# Patient Record
Sex: Female | Born: 1975 | Race: Black or African American | Hispanic: No | Marital: Single | Smoking: Never smoker
Health system: Southern US, Community
[De-identification: ages and names within clinical notes are randomized; demographics above are authoritative.]

## PROBLEM LIST (undated history)

## (undated) DIAGNOSIS — C541 Malignant neoplasm of endometrium: Secondary | ICD-10-CM

## (undated) DIAGNOSIS — D649 Anemia, unspecified: Secondary | ICD-10-CM

## (undated) DIAGNOSIS — N920 Excessive and frequent menstruation with regular cycle: Secondary | ICD-10-CM

## (undated) HISTORY — DX: Malignant neoplasm of endometrium: C54.1

## (undated) HISTORY — PX: WISDOM TOOTH EXTRACTION: SHX21

## (undated) HISTORY — PX: DILATION AND CURETTAGE OF UTERUS: SHX78

## (undated) HISTORY — DX: Anemia, unspecified: D64.9

## (undated) HISTORY — DX: Excessive and frequent menstruation with regular cycle: N92.0

---

## 1999-05-02 ENCOUNTER — Ambulatory Visit (HOSPITAL_BASED_OUTPATIENT_CLINIC_OR_DEPARTMENT_OTHER): Admission: RE | Admit: 1999-05-02 | Discharge: 1999-05-02 | Payer: Self-pay | Admitting: *Deleted

## 2010-01-21 ENCOUNTER — Inpatient Hospital Stay (HOSPITAL_COMMUNITY): Admission: AD | Admit: 2010-01-21 | Discharge: 2010-01-21 | Payer: Self-pay | Admitting: Obstetrics & Gynecology

## 2010-02-03 ENCOUNTER — Emergency Department (HOSPITAL_COMMUNITY): Admission: EM | Admit: 2010-02-03 | Discharge: 2010-02-03 | Payer: Self-pay | Admitting: Emergency Medicine

## 2010-02-17 ENCOUNTER — Emergency Department (HOSPITAL_COMMUNITY): Admission: EM | Admit: 2010-02-17 | Discharge: 2010-02-17 | Payer: Self-pay | Admitting: Emergency Medicine

## 2010-02-17 ENCOUNTER — Ambulatory Visit: Payer: Self-pay | Admitting: Obstetrics & Gynecology

## 2010-02-18 ENCOUNTER — Encounter: Payer: Self-pay | Admitting: Obstetrics & Gynecology

## 2010-02-18 ENCOUNTER — Ambulatory Visit: Payer: Self-pay | Admitting: Vascular Surgery

## 2010-03-10 ENCOUNTER — Ambulatory Visit: Payer: Self-pay | Admitting: Obstetrics & Gynecology

## 2010-03-10 LAB — CONVERTED CEMR LAB: Pap Smear: NEGATIVE

## 2010-03-22 ENCOUNTER — Inpatient Hospital Stay (HOSPITAL_COMMUNITY): Admission: AD | Admit: 2010-03-22 | Discharge: 2010-03-22 | Payer: Self-pay | Admitting: Obstetrics & Gynecology

## 2010-03-23 ENCOUNTER — Inpatient Hospital Stay (HOSPITAL_COMMUNITY): Admission: EM | Admit: 2010-03-23 | Discharge: 2010-03-25 | Payer: Self-pay | Admitting: Emergency Medicine

## 2010-03-23 ENCOUNTER — Encounter: Payer: Self-pay | Admitting: Obstetrics & Gynecology

## 2010-03-23 ENCOUNTER — Ambulatory Visit: Payer: Self-pay | Admitting: Vascular Surgery

## 2010-03-23 DIAGNOSIS — I82409 Acute embolism and thrombosis of unspecified deep veins of unspecified lower extremity: Secondary | ICD-10-CM | POA: Insufficient documentation

## 2010-03-23 LAB — CONVERTED CEMR LAB
Albumin: 3 g/dL
BUN: 11 mg/dL
Calcium: 9 mg/dL
Chloride: 107 meq/L
Glucose, Bld: 152 mg/dL
Potassium: 3.9 meq/L
Sodium: 138 meq/L
Total Protein: 7.1 g/dL

## 2010-03-25 ENCOUNTER — Encounter (INDEPENDENT_AMBULATORY_CARE_PROVIDER_SITE_OTHER): Payer: Self-pay | Admitting: Nurse Practitioner

## 2010-03-28 ENCOUNTER — Encounter (INDEPENDENT_AMBULATORY_CARE_PROVIDER_SITE_OTHER): Payer: Self-pay | Admitting: Nurse Practitioner

## 2010-03-28 ENCOUNTER — Ambulatory Visit: Payer: Self-pay | Admitting: Internal Medicine

## 2010-03-31 ENCOUNTER — Ambulatory Visit: Payer: Self-pay | Admitting: Nurse Practitioner

## 2010-04-01 ENCOUNTER — Ambulatory Visit: Payer: Self-pay | Admitting: Obstetrics & Gynecology

## 2010-04-04 ENCOUNTER — Ambulatory Visit: Payer: Self-pay | Admitting: Nurse Practitioner

## 2010-04-05 LAB — CONVERTED CEMR LAB
HCT: 31.9 %
MCHC: 33 g/dL
MCV: 74.7 fL
Platelets: 430 10*3/uL
RBC: 4.27 M/uL
WBC: 9.5 10*3/uL

## 2010-04-06 ENCOUNTER — Encounter (INDEPENDENT_AMBULATORY_CARE_PROVIDER_SITE_OTHER): Payer: Self-pay | Admitting: Nurse Practitioner

## 2010-04-07 ENCOUNTER — Ambulatory Visit: Payer: Self-pay | Admitting: Obstetrics & Gynecology

## 2010-04-07 ENCOUNTER — Ambulatory Visit (HOSPITAL_COMMUNITY): Admission: RE | Admit: 2010-04-07 | Discharge: 2010-04-07 | Payer: Self-pay | Admitting: Obstetrics & Gynecology

## 2010-04-07 DIAGNOSIS — C541 Malignant neoplasm of endometrium: Secondary | ICD-10-CM | POA: Insufficient documentation

## 2010-04-08 ENCOUNTER — Ambulatory Visit: Payer: Self-pay | Admitting: Internal Medicine

## 2010-04-08 DIAGNOSIS — N920 Excessive and frequent menstruation with regular cycle: Secondary | ICD-10-CM

## 2010-04-08 DIAGNOSIS — D508 Other iron deficiency anemias: Secondary | ICD-10-CM | POA: Insufficient documentation

## 2010-04-08 DIAGNOSIS — E669 Obesity, unspecified: Secondary | ICD-10-CM

## 2010-04-08 DIAGNOSIS — R030 Elevated blood-pressure reading, without diagnosis of hypertension: Secondary | ICD-10-CM | POA: Insufficient documentation

## 2010-04-11 ENCOUNTER — Ambulatory Visit: Payer: Self-pay | Admitting: Nurse Practitioner

## 2010-04-11 ENCOUNTER — Encounter (INDEPENDENT_AMBULATORY_CARE_PROVIDER_SITE_OTHER): Payer: Self-pay | Admitting: Nurse Practitioner

## 2010-04-12 ENCOUNTER — Encounter: Payer: Self-pay | Admitting: Nurse Practitioner

## 2010-04-12 LAB — CONVERTED CEMR LAB: Prothrombin Time: 19.6 s

## 2010-04-14 ENCOUNTER — Ambulatory Visit: Payer: Self-pay | Admitting: Nurse Practitioner

## 2010-04-19 ENCOUNTER — Encounter: Payer: Self-pay | Admitting: Nurse Practitioner

## 2010-04-19 LAB — CONVERTED CEMR LAB: INR: 2.1

## 2010-04-20 ENCOUNTER — Ambulatory Visit: Payer: Self-pay | Admitting: Obstetrics & Gynecology

## 2010-04-20 ENCOUNTER — Ambulatory Visit: Payer: Self-pay | Admitting: Nurse Practitioner

## 2010-04-21 ENCOUNTER — Ambulatory Visit: Admission: RE | Admit: 2010-04-21 | Discharge: 2010-04-21 | Payer: Self-pay | Admitting: Gynecologic Oncology

## 2010-04-22 ENCOUNTER — Encounter: Payer: Self-pay | Admitting: Nurse Practitioner

## 2010-05-02 ENCOUNTER — Encounter (INDEPENDENT_AMBULATORY_CARE_PROVIDER_SITE_OTHER): Payer: Self-pay | Admitting: Nurse Practitioner

## 2010-05-02 ENCOUNTER — Ambulatory Visit: Payer: Self-pay | Admitting: Nurse Practitioner

## 2010-05-03 ENCOUNTER — Encounter: Payer: Self-pay | Admitting: Nurse Practitioner

## 2010-05-27 ENCOUNTER — Ambulatory Visit: Payer: Self-pay | Admitting: Nurse Practitioner

## 2010-05-30 ENCOUNTER — Ambulatory Visit: Payer: Self-pay | Admitting: Obstetrics & Gynecology

## 2010-06-02 ENCOUNTER — Encounter: Payer: Self-pay | Admitting: Nurse Practitioner

## 2010-06-14 ENCOUNTER — Encounter (INDEPENDENT_AMBULATORY_CARE_PROVIDER_SITE_OTHER): Payer: Self-pay | Admitting: Nurse Practitioner

## 2010-06-14 ENCOUNTER — Ambulatory Visit: Payer: Self-pay | Admitting: Internal Medicine

## 2010-06-15 ENCOUNTER — Encounter: Payer: Self-pay | Admitting: Nurse Practitioner

## 2010-06-15 LAB — CONVERTED CEMR LAB: INR: 2.2

## 2010-07-05 ENCOUNTER — Encounter (INDEPENDENT_AMBULATORY_CARE_PROVIDER_SITE_OTHER): Payer: Self-pay | Admitting: Nurse Practitioner

## 2010-07-05 ENCOUNTER — Encounter (INDEPENDENT_AMBULATORY_CARE_PROVIDER_SITE_OTHER): Payer: Self-pay | Admitting: Internal Medicine

## 2010-07-05 ENCOUNTER — Ambulatory Visit: Payer: Self-pay | Admitting: Nurse Practitioner

## 2010-07-05 LAB — CONVERTED CEMR LAB
INR: 2.17 — ABNORMAL HIGH (ref <1.50)
Prothrombin Time: 24.3 s — ABNORMAL HIGH (ref 11.6–15.2)

## 2010-08-02 ENCOUNTER — Encounter (INDEPENDENT_AMBULATORY_CARE_PROVIDER_SITE_OTHER): Payer: Self-pay | Admitting: Nurse Practitioner

## 2010-08-02 ENCOUNTER — Encounter (INDEPENDENT_AMBULATORY_CARE_PROVIDER_SITE_OTHER): Payer: Self-pay | Admitting: Internal Medicine

## 2010-08-09 ENCOUNTER — Encounter (INDEPENDENT_AMBULATORY_CARE_PROVIDER_SITE_OTHER): Payer: Self-pay | Admitting: Internal Medicine

## 2010-08-09 ENCOUNTER — Encounter (INDEPENDENT_AMBULATORY_CARE_PROVIDER_SITE_OTHER): Payer: Self-pay | Admitting: Nurse Practitioner

## 2010-08-09 LAB — CONVERTED CEMR LAB
INR: 1.76 — ABNORMAL HIGH (ref <1.50)
Prothrombin Time: 20.7 s — ABNORMAL HIGH (ref 11.6–15.2)

## 2010-08-10 ENCOUNTER — Encounter: Payer: Self-pay | Admitting: Nurse Practitioner

## 2010-08-10 LAB — CONVERTED CEMR LAB: Prothrombin Time: 17.7 s — ABNORMAL HIGH (ref 11.6–15.2)

## 2010-08-12 ENCOUNTER — Ambulatory Visit: Payer: Self-pay | Admitting: Obstetrics & Gynecology

## 2010-08-18 ENCOUNTER — Encounter (INDEPENDENT_AMBULATORY_CARE_PROVIDER_SITE_OTHER): Payer: Self-pay | Admitting: Internal Medicine

## 2010-08-18 ENCOUNTER — Encounter (INDEPENDENT_AMBULATORY_CARE_PROVIDER_SITE_OTHER): Payer: Self-pay | Admitting: Nurse Practitioner

## 2010-08-31 ENCOUNTER — Ambulatory Visit: Payer: Self-pay | Admitting: Obstetrics and Gynecology

## 2010-09-05 ENCOUNTER — Encounter (INDEPENDENT_AMBULATORY_CARE_PROVIDER_SITE_OTHER): Payer: Self-pay | Admitting: Nurse Practitioner

## 2010-09-12 ENCOUNTER — Encounter: Payer: Self-pay | Admitting: Nurse Practitioner

## 2010-09-29 ENCOUNTER — Encounter (INDEPENDENT_AMBULATORY_CARE_PROVIDER_SITE_OTHER): Payer: Self-pay | Admitting: Nurse Practitioner

## 2010-09-29 ENCOUNTER — Encounter (INDEPENDENT_AMBULATORY_CARE_PROVIDER_SITE_OTHER): Payer: Self-pay | Admitting: Internal Medicine

## 2010-09-29 LAB — CONVERTED CEMR LAB: Prothrombin Time: 32 s — ABNORMAL HIGH (ref 11.6–15.2)

## 2010-09-30 ENCOUNTER — Ambulatory Visit: Payer: Self-pay | Admitting: Obstetrics & Gynecology

## 2010-10-12 ENCOUNTER — Ambulatory Visit: Payer: Self-pay | Admitting: Obstetrics and Gynecology

## 2010-10-12 ENCOUNTER — Encounter (INDEPENDENT_AMBULATORY_CARE_PROVIDER_SITE_OTHER): Payer: Self-pay | Admitting: Nurse Practitioner

## 2010-10-21 ENCOUNTER — Encounter: Payer: Self-pay | Admitting: Nurse Practitioner

## 2010-11-01 ENCOUNTER — Encounter (INDEPENDENT_AMBULATORY_CARE_PROVIDER_SITE_OTHER): Payer: Self-pay | Admitting: Nurse Practitioner

## 2010-11-07 ENCOUNTER — Encounter: Payer: Self-pay | Admitting: Nurse Practitioner

## 2010-11-17 ENCOUNTER — Ambulatory Visit: Admit: 2010-11-17 | Payer: Self-pay | Admitting: Obstetrics and Gynecology

## 2010-11-22 ENCOUNTER — Encounter (INDEPENDENT_AMBULATORY_CARE_PROVIDER_SITE_OTHER): Payer: Self-pay | Admitting: Nurse Practitioner

## 2010-11-22 NOTE — Letter (Signed)
Summary: ANTICOAGULATION DOSING  ANTICOAGULATION DOSING   Imported By: Arta Bruce 05/04/2010 10:26:44  _____________________________________________________________________  External Attachment:    Type:   Image     Comment:   External Document

## 2010-11-22 NOTE — Miscellaneous (Signed)
Summary: PT/INR  Clinical Lists Changes  Anticoagulation Management History:      The patient is on warfarin for the first episode of deep venous thrombosis and/or pulmonary embolism.  Negative risk factors for bleeding include an age less than 34 years old, no history of CVA/TIA, no history of GI bleeding, and absence of serious comorbidities.  The bleeding index is 'low risk'.  Negative CHADS2 values include History of CHF, History of HTN, Age > 2 years old, History of Diabetes, and Prior Stroke/CVA/TIA.  The CHADS2 risk level is 'Low (CHADS2<2)-Bridging Rx Optional'.  The start date was 04/08/2010.  Anticipated length of treatment is 3-6 months.  Today's INR is 1.5.  Prothrombin time is 19.6.    Anticoagulation Management Assessment/Plan:      The patient's current anticoagulation dose is Coumadin 10 mg tabs: One tablet by mouth daily.  The target INR is 2.0-3.0.         Prior Anticoagulation Instructions: Coumadin 10 mg tabs: One tablet by mouth daily.    Current Anticoagulation Instructions: Per coumdin clinic:15mg  x 2 days then 10mg  by mouth daily recheck in 4 days  Observations: Added new observation of PT PATIENT: 19.6 s (04/12/2010 13:00) Added new observation of INR: 1.5  (04/12/2010 13:00) Added new observation of INSTRUCT C: Per coumdin clinic:15mg  x 2 days then 10mg  by mouth daily recheck in 4 days  (04/12/2010 13:00) Added new observation of COUM DOSE: 13^08^65^78^46^96^29  (04/12/2010 13:00) Added new observation of COUMADIN CHG: 0  (04/12/2010 13:00) Added new observation of COUM TOT WK: 70 mg  (04/12/2010 13:00)

## 2010-11-22 NOTE — Miscellaneous (Signed)
Summary: Coumadin Clinic Update   Anticoagulation Management History:      The patient is taking warfarin and comes in today for a routine follow up visit.  The patient is on warfarin for the first episode of deep venous thrombosis and/or pulmonary embolism.  Negative risk factors for bleeding include an age less than 34 years old, no history of CVA/TIA, no history of GI bleeding, and absence of serious comorbidities.  The bleeding index is 'low risk'.  Negative CHADS2 values include History of CHF, History of HTN, Age > 64 years old, History of Diabetes, and Prior Stroke/CVA/TIA.  The CHADS2 risk level is 'Low (CHADS2<2)-Bridging Rx Optional'.  The start date was 04/08/2010.  Anticipated length of treatment is 3-6 months.  Her last INR was 2.1 and today's INR is 2.1.    Anticoagulation Management Assessment/Plan:      The patient's current anticoagulation dose is Coumadin 10 mg tabs: One tablet by mouth daily.  The target INR is 2.0-3.0.  The next INR is due 04/29/2010.         Current Anticoagulation Instructions: Same as Prior Instructions. Clinical Lists Changes  Observations: Added new observation of NEXT PT: 04/29/2010 (04/22/2010 9:19) Added new observation of INSTRUCT C: The patient is to continue with the same dose of coumadin.  This dosage includes:  Coumadin 10 mg tabs: One tablet by mouth daily.   (04/22/2010 9:19) Added new observation of INR: 2.1  (04/22/2010 9:19) Added new observation of COUM DOSE: 08^65^78^46^96^29^52  (04/22/2010 9:19) Added new observation of COUMADIN CHG: 0  (04/22/2010 9:19) Added new observation of COUM TOT WK: 70 mg  (04/22/2010 9:19)

## 2010-11-22 NOTE — Letter (Signed)
Summary: ANTICOAGULATION DOSING  ANTICOAGULATION DOSING   Imported By: Arta Bruce 09/06/2010 15:04:05  _____________________________________________________________________  External Attachment:    Type:   Image     Comment:   External Document

## 2010-11-22 NOTE — Letter (Signed)
Summary: ANTICOAGULATION DOSING SERVICES  ANTICOAGULATION DOSING SERVICES   Imported By: Arta Bruce 06/08/2010 12:39:42  _____________________________________________________________________  External Attachment:    Type:   Image     Comment:   External Document

## 2010-11-22 NOTE — Letter (Signed)
Summary: ANTICOAGULATION DOSING  ANTICOAGULATION DOSING   Imported By: Arta Bruce 04/22/2010 10:04:45  _____________________________________________________________________  External Attachment:    Type:   Image     Comment:   External Document

## 2010-11-22 NOTE — Letter (Signed)
Summary: ANTICOAGULATION DOSING  ANTICOAGULATION DOSING   Imported By: Arta Bruce 04/14/2010 12:57:15  _____________________________________________________________________  External Attachment:    Type:   Image     Comment:   External Document

## 2010-11-22 NOTE — Letter (Signed)
Summary: ANTICOAGULATION DOSING  ANTICOAGULATION DOSING   Imported By: Arta Bruce 07/12/2010 14:19:14  _____________________________________________________________________  External Attachment:    Type:   Image     Comment:   External Document

## 2010-11-22 NOTE — Miscellaneous (Signed)
Summary: Coumadin clinic update  Clinical Lists Changes  Observations: Added new observation of NEXT PT: 06/10/2010 (06/02/2010 9:11) Added new observation of INSTRUCT C: coumadin 15mg  x 1 then resume Coumadin 10 mg tabs: One tablet by mouth daily.   (06/02/2010 9:11) Added new observation of INR: 1.8  (06/02/2010 9:11) Added new observation of COUM DOSE: 40^34^74^25^95^63^87  (06/02/2010 9:11) Added new observation of COUMADIN CHG: 0  (06/02/2010 9:11) Added new observation of COUM TOT WK: 70 mg  (06/02/2010 9:11)     Anticoagulation Management History:      The patient is taking warfarin and comes in today for a routine follow up visit.  She is being anticoagulated due to the first episode of deep venous thrombosis and/or pulmonary embolism.  Negative risk factors for bleeding include an age less than 1 years old, no history of CVA/TIA, no history of GI bleeding, and absence of serious comorbidities.  The bleeding index is 'low risk'.  Negative CHADS2 values include History of CHF, History of HTN, Age > 78 years old, History of Diabetes, and Prior Stroke/CVA/TIA.  The CHADS2 risk level is 'Low (CHADS2<2)-Bridging Rx Optional'.  The start date was 04/08/2010.  Anticipated length of treatment is 3-6 months.  Her last INR was 2.0 and today's INR is 1.8.     Anticoagulation Management Assessment/Plan:      The patient's current anticoagulation dose is Coumadin 10 mg tabs: One tablet by mouth daily.  The target INR is 2.0-3.0.  The next INR is due 06/10/2010.         Prior Anticoagulation Instructions: The patient is to continue with the same dose of coumadin.  This dosage includes:  Coumadin 10 mg tabs: One tablet by mouth daily.    Current Anticoagulation Instructions: coumadin 15mg  x 1 then resume Coumadin 10 mg tabs: One tablet by mouth daily.

## 2010-11-22 NOTE — Assessment & Plan Note (Signed)
Summary: NEW - Establish    Vital Signs:  Patient profile:   35 year old female LMP:     04/04/2010 Height:      63.50 inches Weight:      418.7 pounds BMI:     73.27 BSA:     2.66 Temp:     97.9 degrees F oral Pulse rate:   97 / minute Pulse rhythm:   regular Resp:     20 per minute BP sitting:   167 / 72  (left arm) Cuff size:   regular  Vitals Entered By: Levon Hedger (April 08, 2010 2:30 PM) CC: follow-up visit [place] .Marland Kitchen..pt is having a problem with her periods Is Patient Diabetic? No Pain Assessment Patient in pain? no       Does patient need assistance? Functional Status Self care Ambulation Normal LMP (date): 04/04/2010     Enter LMP: 04/04/2010 Last PAP Result NEGATIVE FOR INTRAEPITHELIAL LESIONS OR MALIGNANCY.   CC:  follow-up visit [place] .Marland Kitchen..pt is having a problem with her periods.  History of Present Illness:  Pt into the office to establish care. No previous PCP  GYN - pt had last visit on yesterday to GYN. Started in April with menorrhagia. She was started on oral contraceptives after a few weeks of being on the med she went to the ER and was dx with anemia secondary to menorrhagia.  C/o dizziness and SOB. She was transfused 5 units PRBC's.   A few weeks later she started with pain and swelling in her right lower extremitiy. Pt then went to the ER and she was admitted with Right DVT -Tobacco +oral contraceptives at time of DVT Dx but have since been discontinued -family history of blood clots PT/INR has been checked at the coumadin clnic at [facility] earlier this week.  She stopped lovenox for 1 day in preparation for outpt GYN procedure (D&C) and restarted today.  She has 3 doses left. 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  Negative risk factors for bleeding include an age less than 77 years old, no history of CVA/TIA, no history of GI bleeding, and absence of serious comorbidities.  The bleeding index is 'low risk'.  Negative CHADS2 values include History of CHF, History of HTN, Age > 96 years old, History of Diabetes, and Prior Stroke/CVA/TIA.  The CHADS2 risk level is 'Low (CHADS2<2)-Bridging Rx Optional'.  The start date is 04/08/2010.  Anticipated length of treatment is 3-6 months.     Habits & Providers  Alcohol-Tobacco-Diet     Alcohol drinks/day: 0     Tobacco Status: never  Exercise-Depression-Behavior     Does Patient Exercise: no     Drug Use: never  Allergies (verified): 1)  ! Vicodin 2)  ! Morphine  Family History: Said to be positive for htn and diabetes  Social History: No children No ETOH NO tobacco  No drugsSmoking Status:  never Drug Use:  never Does Patient Exercise:  no  Review of Systems General:  Denies fever. CV:  Denies chest pain or discomfort. Resp:  Denies cough. GI:  Denies abdominal pain, nausea, and vomiting. GU:  Denies abnormal vaginal bleeding; s/p GYN procedure on yesterday.  Physical Exam  General:  alert.  morbid obesity Head:  normocephalic.   Lungs:  normal breath sounds.   Heart:  normal rate and regular rhythm.   Msk:  negative homan's Pulses:  DP +2 Extremities:  no edema Neurologic:  alert & oriented X3.   Psych:  Oriented X3.     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 One tablet by mouth daily  Complete Medication List: 1)  Ferrous Sulfate 325 (65 Fe) Mg Tabs (Ferrous sulfate) .... One tablet by mouth daily 2)  Multivitamins Tabs (Multiple vitamin) .... One tablet by mouth daily 3)  Coumadin 10 Mg Tabs (Warfarin sodium) .... One tablet by mouth daily  Anticoagulation Management Assessment/Plan:      The patient's current anticoagulation dose is Coumadin 10 mg tabs: One tablet by mouth daily.  The target INR is 2.0-3.0.         Current Anticoagulation Instructions: Coumadin 10 mg tabs: One tablet by mouth daily.    Patient Instructions: 1)  Keep your appointment at the coumadin clinic on Monday at 2:10PM 2)  Finish the lovenox as ordered

## 2010-11-22 NOTE — Letter (Signed)
Summary: ANTICOAGULATION DOSING  ANTICOAGULATION DOSING   Imported By: Arta Bruce 04/20/2010 12:57:14  _____________________________________________________________________  External Attachment:    Type:   Image     Comment:   External Document

## 2010-11-22 NOTE — Miscellaneous (Signed)
Summary: Coumadin clinic update   Anticoagulation Management History:      The patient is taking warfarin and comes in today for a routine follow up visit.  She is being anticoagulated due to the first episode of deep venous thrombosis and/or pulmonary embolism.  Negative risk factors for bleeding include an age less than 34 years old, no history of CVA/TIA, no history of GI bleeding, and absence of serious comorbidities.  The bleeding index is 'low risk'.  Negative CHADS2 values include History of CHF, History of HTN, Age > 5 years old, History of Diabetes, and Prior Stroke/CVA/TIA.  The CHADS2 risk level is 'Low (CHADS2<2)-Bridging Rx Optional'.  The start date was 04/08/2010.  Anticipated length of treatment is 3-6 months.  Her last INR was 2.1 and today's INR is 2.0.    Anticoagulation Management Assessment/Plan:      The patient's current anticoagulation dose is Coumadin 10 mg tabs: One tablet by mouth daily.  The target INR is 2.0-3.0.  The next INR is due 05/23/2010.         Current Anticoagulation Instructions: Same as Prior Instructions. Clinical Lists Changes  Observations: Added new observation of NEXT PT: 05/23/2010 (05/03/2010 15:16) Added new observation of INSTRUCT C: The patient is to continue with the same dose of coumadin.  This dosage includes:  Coumadin 10 mg tabs: One tablet by mouth daily.   (05/03/2010 15:16) Added new observation of INR: 2.0  (05/03/2010 15:16) Added new observation of COUM DOSE: 42^70^62^37^62^83^15  (05/03/2010 15:16) Added new observation of COUMADIN CHG: 0  (05/03/2010 15:16) Added new observation of COUM TOT WK: 70 mg  (05/03/2010 15:16)

## 2010-11-22 NOTE — Letter (Signed)
Summary: ANYICOAGULATION DOSING  ANYICOAGULATION DOSING   Imported By: Arta Bruce 08/25/2010 15:09:51  _____________________________________________________________________  External Attachment:    Type:   Image     Comment:   External Document

## 2010-11-22 NOTE — Letter (Signed)
Summary: ANTICOAGULATION DOSING  ANTICOAGULATION DOSING   Imported By: Arta Bruce 06/16/2010 12:44:33  _____________________________________________________________________  External Attachment:    Type:   Image     Comment:   External Document

## 2010-11-22 NOTE — Miscellaneous (Signed)
Summary: Coumadin Clinic update   Anticoagulation Management History:      The patient is taking warfarin and comes in today for a routine follow up visit.  The patient is on warfarin for the first episode of deep venous thrombosis and/or pulmonary embolism.  Negative risk factors for bleeding include an age less than 34 years old, no history of CVA/TIA, no history of GI bleeding, and absence of serious comorbidities.  The bleeding index is 'low risk'.  Negative CHADS2 values include History of CHF, History of HTN, Age > 80 years old, History of Diabetes, and Prior Stroke/CVA/TIA.  The CHADS2 risk level is 'Low (CHADS2<2)-Bridging Rx Optional'.  The start date was 04/08/2010.  Anticipated length of treatment is 3-6 months.  Her last INR was 1.5 and today's INR is 2.1.    Anticoagulation Management Assessment/Plan:      The patient's current anticoagulation dose is Coumadin 10 mg tabs: One tablet by mouth daily.  The target INR is 2.0-3.0.  The next INR is due 04/21/2010.         Current Anticoagulation Instructions: The patient is to continue with the same dose of coumadin.  This dosage includes:  Coumadin 10 mg tabs: One tablet by mouth daily.   Clinical Lists Changes  Observations: Added new observation of INR: 2.1  (04/19/2010 15:09) Added new observation of NEXT PT: 04/21/2010 days (04/19/2010 15:09) Added new observation of INSTRUCT C: The patient is to continue with the same dose of coumadin.  This dosage includes:  Coumadin 10 mg tabs: One tablet by mouth daily.    (04/19/2010 15:09) Added new observation of COUM DOSE: 13^08^65^78^46^96^29  (04/19/2010 15:09) Added new observation of COUMADIN CHG: 0  (04/19/2010 15:09) Added new observation of COUM TOT WK: 70 mg  (04/19/2010 15:09)

## 2010-11-22 NOTE — Letter (Signed)
Summary: Discharge Summary  Discharge Summary   Imported By: Arta Bruce 04/11/2010 15:14:06  _____________________________________________________________________  External Attachment:    Type:   Image     Comment:   External Document

## 2010-11-22 NOTE — Letter (Signed)
Summary: ANTICOAGULATION DOSING  ANTICOAGULATION DOSING   Imported By: Arta Bruce 09/13/2010 10:26:28  _____________________________________________________________________  External Attachment:    Type:   Image     Comment:   External Document

## 2010-11-22 NOTE — Letter (Signed)
Summary: ANTICOAGULATION DOSING  ANTICOAGULATION DOSING   Imported By: Arta Bruce 09/13/2010 10:15:38  _____________________________________________________________________  External Attachment:    Type:   Image     Comment:   External Document

## 2010-11-22 NOTE — Letter (Signed)
Summary: ANTICOAULATION DOSING  ANTICOAULATION DOSING   Imported By: Arta Bruce 08/11/2010 11:23:12  _____________________________________________________________________  External Attachment:    Type:   Image     Comment:   External Document

## 2010-11-22 NOTE — Letter (Signed)
Summary: ANTICOAGULATION DOSING  ANTICOAGULATION DOSING   Imported By: Arta Bruce 04/12/2010 14:37:16  _____________________________________________________________________  External Attachment:    Type:   Image     Comment:   External Document

## 2010-11-22 NOTE — Letter (Signed)
Summary: ANTICOAGULATION DOSING  ANTICOAGULATION DOSING   Imported By: Arta Bruce 05/04/2010 10:13:04  _____________________________________________________________________  External Attachment:    Type:   Image     Comment:   External Document

## 2010-11-22 NOTE — Miscellaneous (Signed)
Summary: Med update   Anticoagulation Management History:      Warfarin therapy is being given due to the first episode of deep venous thrombosis and/or pulmonary embolism.  Negative risk factors for bleeding include an age less than 34 years old, no history of CVA/TIA, no history of GI bleeding, and absence of serious comorbidities.  The bleeding index is 'low risk'.  Negative CHADS2 values include History of CHF, History of HTN, Age > 48 years old, History of Diabetes, and Prior Stroke/CVA/TIA.  The CHADS2 risk level is 'Low (CHADS2<2)-Bridging Rx Optional'.  The start date was 04/08/2010.  Anticipated length of treatment is 3-6 months.  Her last INR was 2.01 and today's INR is 4.1.    Anticoagulation Management Assessment/Plan:      The patient's current anticoagulation dose is Coumadin 10 mg tabs: One tablet by mouth daily.  The target INR is 2.0-3.0.  The next INR is due 09/12/2010.         Prior Anticoagulation Instructions: Coumadin 10 mg tabs: One tablet by mouth daily.  The patient is to continue with the same dose of coumadin.    Current Anticoagulation Instructions: hold for 2 days then resume Clinical Lists Changes  Observations: Added new observation of NEXT PT: 09/12/2010 (09/12/2010 18:11) Added new observation of INSTRUCT C: hold for 2 days then resume (09/12/2010 18:11) Added new observation of INR: 4.1  (09/12/2010 18:11) Added new observation of COUM DOSE: 81^19^14^78^29^56^21  (09/12/2010 18:11) Added new observation of COUMADIN CHG: 0  (09/12/2010 18:11) Added new observation of COUM TOT WK: 70 mg  (09/12/2010 18:11)

## 2010-11-22 NOTE — Miscellaneous (Signed)
Summary: Coumadin clinic update   Anticoagulation Management History:      Coumadin therapy is being given due to the first episode of deep venous thrombosis and/or pulmonary embolism.  Anticipated length of treatment is 3-6 months.  Her last INR was 4.1 and today's INR is 2.3.    Anticoagulation Management Assessment/Plan:      The target INR is 2.0-3.0.  She is to have a 09/22/2010.          Anticoagulation Management History:      Coumadin therapy is being given due to the first episode of deep venous thrombosis and/or pulmonary embolism.  Anticipated length of treatment is 3-6 months.  Her last INR was 4.1 and today's INR is 2.3.    Anticoagulation Management Assessment/Plan:      The target INR is 2.0-3.0.  She is to have a 09/22/2010.        Clinical Lists Changes  Observations: Added new observation of NEXT PT: 09/22/2010 (09/12/2010 18:54) Added new observation of INSTRUCT C: Coumadin 10 mg tabs: One tablet by mouth daily.   (09/12/2010 18:54) Added new observation of INR: 2.3  (09/12/2010 18:54) Added new observation of COUM DOSE: N/A  (09/12/2010 18:54) Added new observation of COUMADIN CHG: N/A  (09/12/2010 18:54) Added new observation of COUM TOT WK: N/A  (09/12/2010 18:54)

## 2010-11-22 NOTE — Letter (Signed)
Summary: ANTICOAGULATION DOSING  ANTICOAGULATION DOSING   Imported By: Arta Bruce 05/04/2010 10:27:52  _____________________________________________________________________  External Attachment:    Type:   Image     Comment:   External Document

## 2010-11-24 NOTE — Miscellaneous (Signed)
Summary: Coumadin Clinic update   Anticoagulation Management History:      The patient is taking warfarin and comes in today for a routine follow up visit.  Warfarin therapy is being given due to the first episode of deep venous thrombosis and/or pulmonary embolism.  Negative risk factors for bleeding include an age less than 34 years old, no history of CVA/TIA, no history of GI bleeding, and absence of serious comorbidities.  The bleeding index is 'low risk'.  Negative CHADS2 values include History of CHF, History of HTN, Age > 55 years old, History of Diabetes, and Prior Stroke/CVA/TIA.  The CHADS2 risk level is 'Low (CHADS2<2)-Bridging Rx Optional'.  The start date was 04/08/2010.  Anticipated length of treatment is 3-6 months.  Her last INR was 3.10.  Prothrombin time is 2.4.    Anticoagulation Management Assessment/Plan:      The patient's current anticoagulation dose is Coumadin 10 mg tabs: One tablet by mouth daily.  The target INR is 2.0-3.0.  The next INR is due 11/28/2010.         Current Anticoagulation Instructions: The patient is to continue with the same dose of coumadin.  This dosage includes:  Coumadin 10 mg tabs: One tablet by mouth daily.   Clinical Lists Changes  Observations: Added new observation of NEXT PT: 11/28/2010 (11/07/2010 15:13) Added new observation of INSTRUCT C: The patient is to continue with the same dose of coumadin.  This dosage includes:  Coumadin 10 mg tabs: One tablet by mouth daily.   (11/07/2010 15:13) Added new observation of PT PATIENT: 2.4 s (11/07/2010 15:13) Added new observation of COUM DOSE: 16^10^96^04^54^09^81  (11/07/2010 15:13) Added new observation of COUMADIN CHG: 0  (11/07/2010 15:13) Added new observation of COUM TOT WK: 75 mg  (11/07/2010 15:13)

## 2010-11-24 NOTE — Letter (Signed)
Summary: ANTICOAGULATION DOSING SERVICE  ANTICOAGULATION DOSING SERVICE   Imported By: Arta Bruce 10/19/2010 16:52:55  _____________________________________________________________________  External Attachment:    Type:   Image     Comment:   External Document

## 2010-11-24 NOTE — Miscellaneous (Signed)
Summary: Coumadin Clinic    Anticoagulation Management History:      Warfarin therapy is being given due to the first episode of deep venous thrombosis and/or pulmonary embolism.  Negative risk factors for bleeding include an age less than 34 years old, no history of CVA/TIA, no history of GI bleeding, and absence of serious comorbidities.  The bleeding index is 'low risk'.  Negative CHADS2 values include History of CHF, History of HTN, Age > 60 years old, History of Diabetes, and Prior Stroke/CVA/TIA.  The CHADS2 risk level is 'Low (CHADS2<2)-Bridging Rx Optional'.  The start date was 04/08/2010.  Anticipated length of treatment is 3-6 months.  Her last INR was 3.10.    Anticoagulation Management Assessment/Plan:      The patient's current anticoagulation dose is Coumadin 10 mg tabs: One tablet by mouth daily.  The target INR is 2.0-3.0.  The next INR is due 09/22/2010.         Prior Anticoagulation Instructions: Coumadin 10 mg tabs: One tablet by mouth daily.    Current Anticoagulation Instructions: Same as Prior Instructions. Clinical Lists Changes  Observations: Added new observation of SAT. DOSE: 1 tab (10/21/2010 18:52) Added new observation of THURS. DOSE: 1 tab (10/21/2010 18:52) Added new observation of WEDS. DOSE: 1 tab (10/21/2010 18:52) Added new observation of TUESDAY DOSE: 1 tab (10/21/2010 18:52) Added new observation of MONDAY DOSE: 1 tab (10/21/2010 18:52) Added new observation of SUNDAY DOSE: 1 tab (10/21/2010 18:52) Added new observation of COUM TAB MG: 10mg  (10/21/2010 18:52) Added new observation of FRIDAY DOSE: 1.5 tabs (10/21/2010 18:52) Added new observation of INSTRUCT C: Coumadin 10 mg tabs: One tablet by mouth daily.   (10/21/2010 18:52) Added new observation of COUM DOSE: 19^14^78^29^56^21^30 (10/21/2010 18:52) Added new observation of COUMADIN CHG: N/A (10/21/2010 18:52) Added new observation of COUM TOT WK: 75 mg (10/21/2010 18:52)

## 2010-11-24 NOTE — Letter (Signed)
Summary: anticoagulation dosing  anticoagulation dosing   Imported By: Arta Bruce 10/26/2010 11:34:38  _____________________________________________________________________  External Attachment:    Type:   Image     Comment:   External Document

## 2010-11-24 NOTE — Letter (Signed)
Summary: ANTICOAGULATION DOSING SERVICE  ANTICOAGULATION DOSING SERVICE   Imported By: Arta Bruce 11/08/2010 16:18:55  _____________________________________________________________________  External Attachment:    Type:   Image     Comment:   External Document

## 2010-11-25 ENCOUNTER — Encounter: Payer: Self-pay | Admitting: Nurse Practitioner

## 2010-11-28 ENCOUNTER — Encounter (INDEPENDENT_AMBULATORY_CARE_PROVIDER_SITE_OTHER): Payer: Self-pay | Admitting: Nurse Practitioner

## 2010-11-30 NOTE — Miscellaneous (Signed)
Summary: Coumadin Clinic Update   Anticoagulation Management History:      The patient is taking warfarin and comes in today for a routine follow up visit.  Anticoagulation is being administered due to the first episode of deep venous thrombosis and/or pulmonary embolism.  Negative risk factors for bleeding include an age less than 34 years old, no history of CVA/TIA, no history of GI bleeding, and absence of serious comorbidities.  The bleeding index is 'low risk'.  Negative CHADS2 values include History of CHF, History of HTN, Age > 65 years old, History of Diabetes, and Prior Stroke/CVA/TIA.  The CHADS2 risk level is 'Low (CHADS2<2)-Bridging Rx Optional'.  The start date was 04/08/2010.  Anticipated length of treatment is 3-6 months.  Her last INR was 3.10.    Anticoagulation Management Assessment/Plan:      The patient's current anticoagulation dose is Coumadin 10 mg tabs: One tablet by mouth daily.  The target INR is 2.0-3.0.  The next INR is due 11/28/2010.         Current Anticoagulation Instructions:  Checked at coumadin clinic - 11/22/2010 will recheck in 4 weeks The patient is to continue with the same dose of coumadin.  This dosage includes:  Coumadin 10 mg tabs: One tablet by mouth daily.   Clinical Lists Changes  Observations: Added new observation of COUMSTOPDTE: 11/25/2011 (11/25/2010 14:27) Added new observation of TARGET INR: 2.0-3.0 (11/25/2010 14:27) Added new observation of EX TX DURAT: 3-6 months (11/25/2010 14:27) Added new observation of PROTIME DX: DVT/PE (first episode) (11/25/2010 14:27) Added new observation of INSTRUCT C:  Checked at coumadin clinic - 11/22/2010 will recheck in 4 weeks The patient is to continue with the same dose of coumadin.  This dosage includes:  Coumadin 10 mg tabs: One tablet by mouth daily.   (11/25/2010 14:27) Added new observation of COUM DOSE: 40^98^11^91^47^82^95 (11/25/2010 14:27) Added new observation of COUMADIN CHG: 0  (11/25/2010  14:27) Added new observation of COUM TOT WK: 75 mg  (11/25/2010 14:27)

## 2010-12-02 ENCOUNTER — Other Ambulatory Visit: Payer: Self-pay | Admitting: Family Medicine

## 2010-12-02 ENCOUNTER — Ambulatory Visit: Payer: Self-pay | Admitting: Obstetrics & Gynecology

## 2010-12-02 DIAGNOSIS — C549 Malignant neoplasm of corpus uteri, unspecified: Secondary | ICD-10-CM

## 2010-12-02 DIAGNOSIS — Z30431 Encounter for routine checking of intrauterine contraceptive device: Secondary | ICD-10-CM

## 2010-12-02 DIAGNOSIS — Z975 Presence of (intrauterine) contraceptive device: Secondary | ICD-10-CM

## 2010-12-06 ENCOUNTER — Emergency Department (HOSPITAL_COMMUNITY)
Admission: EM | Admit: 2010-12-06 | Discharge: 2010-12-06 | Disposition: A | Discharge status: Home / Self Care | Payer: Self-pay | Attending: Emergency Medicine | Admitting: Emergency Medicine

## 2010-12-06 ENCOUNTER — Ambulatory Visit (HOSPITAL_COMMUNITY)
Admission: RE | Admit: 2010-12-06 | Discharge: 2010-12-06 | Disposition: A | Discharge status: Home / Self Care | Payer: Self-pay | Source: Ambulatory Visit | Attending: Family Medicine | Admitting: Family Medicine

## 2010-12-06 DIAGNOSIS — R300 Dysuria: Secondary | ICD-10-CM | POA: Insufficient documentation

## 2010-12-06 DIAGNOSIS — Z30431 Encounter for routine checking of intrauterine contraceptive device: Secondary | ICD-10-CM | POA: Insufficient documentation

## 2010-12-06 DIAGNOSIS — N920 Excessive and frequent menstruation with regular cycle: Secondary | ICD-10-CM | POA: Insufficient documentation

## 2010-12-06 DIAGNOSIS — M545 Low back pain, unspecified: Secondary | ICD-10-CM | POA: Insufficient documentation

## 2010-12-06 DIAGNOSIS — R000 Tachycardia, unspecified: Secondary | ICD-10-CM | POA: Insufficient documentation

## 2010-12-06 DIAGNOSIS — Z79899 Other long term (current) drug therapy: Secondary | ICD-10-CM | POA: Insufficient documentation

## 2010-12-06 DIAGNOSIS — Z975 Presence of (intrauterine) contraceptive device: Secondary | ICD-10-CM

## 2010-12-06 LAB — URINALYSIS, ROUTINE W REFLEX MICROSCOPIC
Bilirubin Urine: NEGATIVE
Hgb urine dipstick: NEGATIVE
Ketones, ur: NEGATIVE mg/dL
Nitrite: NEGATIVE
Specific Gravity, Urine: 1.027 (ref 1.005–1.030)
Urobilinogen, UA: 0.2 mg/dL (ref 0.0–1.0)

## 2010-12-06 LAB — URINE MICROSCOPIC-ADD ON

## 2010-12-07 LAB — URINE CULTURE
Colony Count: NO GROWTH
Culture  Setup Time: 189908160000

## 2010-12-08 NOTE — Letter (Signed)
Summary: COUMADIN/FAXED TO [facility]  COUMADIN/FAXED TO [facility]   Imported By: Arta Bruce 11/28/2010 10:31:04  _____________________________________________________________________  External Attachment:    Type:   Image     Comment:   External Document

## 2010-12-08 NOTE — Letter (Signed)
Summary: ANTICOAGULATION DOSING  ANTICOAGULATION DOSING   Imported By: Arta Bruce 11/28/2010 10:25:46  _____________________________________________________________________  External Attachment:    Type:   Image     Comment:   External Document

## 2010-12-20 ENCOUNTER — Encounter (INDEPENDENT_AMBULATORY_CARE_PROVIDER_SITE_OTHER): Payer: Self-pay | Admitting: Nurse Practitioner

## 2010-12-26 ENCOUNTER — Encounter: Payer: Self-pay | Admitting: Nurse Practitioner

## 2010-12-26 ENCOUNTER — Encounter (INDEPENDENT_AMBULATORY_CARE_PROVIDER_SITE_OTHER): Payer: Self-pay | Admitting: Nurse Practitioner

## 2011-01-01 ENCOUNTER — Emergency Department (HOSPITAL_COMMUNITY)
Admission: EM | Admit: 2011-01-01 | Discharge: 2011-01-02 | Disposition: A | Discharge status: Home / Self Care | Payer: Self-pay | Attending: Emergency Medicine | Admitting: Emergency Medicine

## 2011-01-01 ENCOUNTER — Emergency Department (HOSPITAL_COMMUNITY): Payer: Self-pay

## 2011-01-01 ENCOUNTER — Encounter (INDEPENDENT_AMBULATORY_CARE_PROVIDER_SITE_OTHER): Payer: Self-pay | Admitting: Nurse Practitioner

## 2011-01-01 DIAGNOSIS — R109 Unspecified abdominal pain: Secondary | ICD-10-CM | POA: Insufficient documentation

## 2011-01-01 DIAGNOSIS — M545 Low back pain, unspecified: Secondary | ICD-10-CM | POA: Insufficient documentation

## 2011-01-01 DIAGNOSIS — E119 Type 2 diabetes mellitus without complications: Secondary | ICD-10-CM | POA: Insufficient documentation

## 2011-01-01 DIAGNOSIS — R000 Tachycardia, unspecified: Secondary | ICD-10-CM | POA: Insufficient documentation

## 2011-01-01 LAB — URINALYSIS, ROUTINE W REFLEX MICROSCOPIC
Bilirubin Urine: NEGATIVE
Glucose, UA: >1000 mg/dL — AB
Ketones, ur: NEGATIVE mg/dL
Leukocytes, UA: NEGATIVE
Nitrite: NEGATIVE
Protein, ur: NEGATIVE mg/dL
Specific Gravity, Urine: 1.046 — ABNORMAL HIGH (ref 1.005–1.030)
Urobilinogen, UA: 0.2 mg/dL (ref 0.0–1.0)
pH: 5.5 (ref 5.0–8.0)

## 2011-01-01 LAB — URINE MICROSCOPIC-ADD ON

## 2011-01-01 LAB — CONVERTED CEMR LAB
BUN: 10 mg/dL
Glucose, Bld: 311 mg/dL
Potassium: 3.2 meq/L
Sodium: 138 meq/L

## 2011-01-01 LAB — POCT I-STAT, CHEM 8
Chloride: 101 mEq/L (ref 96–112)
HCT: 35 % — ABNORMAL LOW (ref 36.0–46.0)
Hemoglobin: 11.9 g/dL — ABNORMAL LOW (ref 12.0–15.0)
Potassium: 3.2 mEq/L — ABNORMAL LOW (ref 3.5–5.1)
Sodium: 138 mEq/L (ref 135–145)

## 2011-01-01 LAB — POCT PREGNANCY, URINE: Preg Test, Ur: NEGATIVE

## 2011-01-02 LAB — HEMOGLOBIN A1C
Hgb A1c MFr Bld: 12.1 % — ABNORMAL HIGH (ref <5.7)
Mean Plasma Glucose: 301 mg/dL — ABNORMAL HIGH (ref <117)

## 2011-01-03 NOTE — Miscellaneous (Signed)
Summary: Coumadin Clinic   Anticoagulation Management History:      The patient comes in today for her initial visit for anticoagulation therapy.  Anticoagulation is being administered due to the first episode of deep venous thrombosis and/or pulmonary embolism.  Negative risk factors for bleeding include an age less than 34 years old, no history of CVA/TIA, no history of GI bleeding, and absence of serious comorbidities.  The bleeding index is 'low risk'.  Negative CHADS2 values include History of CHF, History of HTN, Age > 58 years old, History of Diabetes, and Prior Stroke/CVA/TIA.  The CHADS2 risk level is 'Low (CHADS2<2)-Bridging Rx Optional'.  The start date was 04/08/2010.  Anticipated length of treatment is 3-6 months.  Her last INR was 3.10.    Anticoagulation Management Assessment/Plan:      The patient's current anticoagulation dose is Coumadin 10 mg tabs: One tablet by mouth daily.  The target INR is 2.0-3.0.  The next INR is due 01/23/2011.         Current Anticoagulation Instructions: Coumadin 10 mg tabs: One tablet by mouth daily 1 1/2 daily on Friday.The patient is to continue with the same dose of coumadin.   Clinical Lists Changes  Observations: Added new observation of NEXT PT: 01/23/2011 (12/26/2010 8:35) Added new observation of INSTRUCT C: Coumadin 10 mg tabs: One tablet by mouth daily 1 1/2 daily on Friday.The patient is to continue with the same dose of coumadin.   (12/26/2010 8:35) Added new observation of COUM DOSE: 11^91^47^82^95^62^13 (12/26/2010 8:35) Added new observation of COUMADIN CHG: 0  (12/26/2010 8:35) Added new observation of COUM TOT WK: 75 mg  (12/26/2010 8:35)

## 2011-01-03 NOTE — Letter (Signed)
Summary: ANTICOAGULATION DOSING  ANTICOAGULATION DOSING   Imported By: Arta Bruce 12/26/2010 10:54:12  _____________________________________________________________________  External Attachment:    Type:   Image     Comment:   External Document

## 2011-01-04 LAB — POCT PREGNANCY, URINE: Preg Test, Ur: NEGATIVE

## 2011-01-09 LAB — CARDIOLIPIN ANTIBODIES, IGG, IGM, IGA: Anticardiolipin IgM: 1 MPL U/mL — ABNORMAL LOW (ref <11)

## 2011-01-09 LAB — BASIC METABOLIC PANEL
BUN: 13 mg/dL (ref 6–23)
CO2: 26 mEq/L (ref 19–32)
Calcium: 9.1 mg/dL (ref 8.4–10.5)
Creatinine, Ser: 0.81 mg/dL (ref 0.4–1.2)
Glucose, Bld: 170 mg/dL — ABNORMAL HIGH (ref 70–99)
Sodium: 136 mEq/L (ref 135–145)

## 2011-01-09 LAB — DIFFERENTIAL
Basophils Absolute: 0 10*3/uL (ref 0.0–0.1)
Basophils Relative: 0 % (ref 0–1)
Eosinophils Absolute: 0.2 10*3/uL (ref 0.0–0.7)
Eosinophils Absolute: 0.2 10*3/uL (ref 0.0–0.7)
Lymphocytes Relative: 22 % (ref 12–46)
Lymphs Abs: 2.2 10*3/uL (ref 0.7–4.0)
Monocytes Absolute: 0.8 10*3/uL (ref 0.1–1.0)
Neutro Abs: 7.1 10*3/uL (ref 1.7–7.7)
Neutro Abs: 7.5 10*3/uL (ref 1.7–7.7)

## 2011-01-09 LAB — COMPREHENSIVE METABOLIC PANEL
ALT: 17 U/L (ref 0–35)
AST: 12 U/L (ref 0–37)
Albumin: 3 g/dL — ABNORMAL LOW (ref 3.5–5.2)
Alkaline Phosphatase: 69 U/L (ref 39–117)
Potassium: 3.9 mEq/L (ref 3.5–5.1)
Sodium: 138 mEq/L (ref 135–145)
Total Protein: 7.1 g/dL (ref 6.0–8.3)

## 2011-01-09 LAB — CBC
Hemoglobin: 10.5 g/dL — ABNORMAL LOW (ref 12.0–15.0)
MCHC: 32.4 g/dL (ref 30.0–36.0)
MCHC: 33 g/dL (ref 30.0–36.0)
Platelets: 376 10*3/uL (ref 150–400)
Platelets: 404 10*3/uL — ABNORMAL HIGH (ref 150–400)
Platelets: 430 10*3/uL — ABNORMAL HIGH (ref 150–400)
RDW: 21.3 % — ABNORMAL HIGH (ref 11.5–15.5)
RDW: 21.4 % — ABNORMAL HIGH (ref 11.5–15.5)
RDW: 21.6 % — ABNORMAL HIGH (ref 11.5–15.5)

## 2011-01-09 LAB — PROTIME-INR
INR: 1.04 (ref 0.00–1.49)
INR: 1.12 (ref 0.00–1.49)
INR: 1.77 — ABNORMAL HIGH (ref 0.00–1.49)
Prothrombin Time: 14.3 seconds (ref 11.6–15.2)
Prothrombin Time: 20.5 seconds — ABNORMAL HIGH (ref 11.6–15.2)

## 2011-01-09 LAB — LUPUS ANTICOAGULANT PANEL
DRVVT: 52.5 secs — ABNORMAL HIGH (ref 36.2–44.3)
Lupus Anticoagulant: DETECTED — AB
PTT Lupus Anticoagulant: 52.6 secs — ABNORMAL HIGH (ref 32.0–43.4)

## 2011-01-09 LAB — APTT: aPTT: 40 seconds — ABNORMAL HIGH (ref 24–37)

## 2011-01-09 LAB — BETA-2-GLYCOPROTEIN I ABS, IGG/M/A
Beta-2-Glycoprotein I IgA: 2 A Units (ref <20)
Beta-2-Glycoprotein I IgM: 0 M Units (ref <20)

## 2011-01-09 LAB — PROTEIN C, TOTAL: Protein C, Total: 72 % (ref 70–140)

## 2011-01-09 LAB — ANTITHROMBIN III: AntiThromb III Func: 110 % (ref 76–126)

## 2011-01-09 LAB — POCT PREGNANCY, URINE: Preg Test, Ur: NEGATIVE

## 2011-01-09 LAB — D-DIMER, QUANTITATIVE: D-Dimer, Quant: 2.39 ug/mL-FEU — ABNORMAL HIGH (ref 0.00–0.48)

## 2011-01-09 LAB — PROTHROMBIN GENE MUTATION

## 2011-01-09 LAB — HOMOCYSTEINE: Homocysteine: 10.7 umol/L (ref 4.0–15.4)

## 2011-01-09 LAB — MAGNESIUM: Magnesium: 2.1 mg/dL (ref 1.5–2.5)

## 2011-01-09 LAB — PROTEIN C ACTIVITY: Protein C Activity: 89 % (ref 75–133)

## 2011-01-10 LAB — CROSSMATCH
ABO/RH(D): A POS
Antibody Screen: NEGATIVE

## 2011-01-10 LAB — CBC
HCT: 19 % — ABNORMAL LOW (ref 36.0–46.0)
HCT: 29 % — ABNORMAL LOW (ref 36.0–46.0)
Hemoglobin: 6.1 g/dL — CL (ref 12.0–15.0)
Hemoglobin: 7.5 g/dL — ABNORMAL LOW (ref 12.0–15.0)
Hemoglobin: 9.6 g/dL — ABNORMAL LOW (ref 12.0–15.0)
MCHC: 31.9 g/dL (ref 30.0–36.0)
MCHC: 32.7 g/dL (ref 30.0–36.0)
MCV: 75.6 fL — ABNORMAL LOW (ref 78.0–100.0)
MCV: 77.6 fL — ABNORMAL LOW (ref 78.0–100.0)
Platelets: 365 10*3/uL (ref 150–400)
RBC: 3.03 MIL/uL — ABNORMAL LOW (ref 3.87–5.11)
RDW: 20.1 % — ABNORMAL HIGH (ref 11.5–15.5)
RDW: 21.4 % — ABNORMAL HIGH (ref 11.5–15.5)
WBC: 12.7 10*3/uL — ABNORMAL HIGH (ref 4.0–10.5)

## 2011-01-10 LAB — POCT I-STAT, CHEM 8
Calcium, Ion: 1.12 mmol/L (ref 1.12–1.32)
HCT: 22 % — ABNORMAL LOW (ref 36.0–46.0)
Hemoglobin: 7.5 g/dL — ABNORMAL LOW (ref 12.0–15.0)
Sodium: 138 mEq/L (ref 135–145)
TCO2: 25 mmol/L (ref 0–100)

## 2011-01-10 LAB — DIFFERENTIAL
Basophils Absolute: 0.1 10*3/uL (ref 0.0–0.1)
Basophils Relative: 1 % (ref 0–1)
Eosinophils Relative: 2 % (ref 0–5)
Monocytes Absolute: 0.8 10*3/uL (ref 0.1–1.0)
Neutro Abs: 8.1 10*3/uL — ABNORMAL HIGH (ref 1.7–7.7)

## 2011-01-10 LAB — PREPARE RBC (CROSSMATCH)

## 2011-01-11 LAB — URINALYSIS, ROUTINE W REFLEX MICROSCOPIC
Ketones, ur: NEGATIVE mg/dL
Leukocytes, UA: NEGATIVE
Nitrite: NEGATIVE
Specific Gravity, Urine: 1.023 (ref 1.005–1.030)
pH: 6.5 (ref 5.0–8.0)

## 2011-01-11 LAB — WET PREP, GENITAL: Yeast Wet Prep HPF POC: NONE SEEN

## 2011-01-11 LAB — SAMPLE TO BLOOD BANK

## 2011-01-11 LAB — CBC
MCHC: 32.4 g/dL (ref 30.0–36.0)
Platelets: 548 10*3/uL — ABNORMAL HIGH (ref 150–400)
RDW: 18.5 % — ABNORMAL HIGH (ref 11.5–15.5)

## 2011-01-11 LAB — URINE MICROSCOPIC-ADD ON

## 2011-01-11 LAB — HCG, SERUM, QUALITATIVE: Preg, Serum: NEGATIVE

## 2011-01-13 NOTE — Progress Notes (Signed)
NAME:  Chelsey Clark, Chelsey Clark               ACCOUNT NO.:  [id number]  MEDICAL RECORD NO.:  [id number]           PATIENT TYPE:  A  LOCATION:  [facility]                   FACILITY:  [facility]  PHYSICIAN:  Jaynie Collins, MD     DATE OF BIRTH:  21-Aug-1976  DATE OF SERVICE:  12/02/2010                                 CLINIC NOTE  REASON FOR VISIT:  IUD string check.  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  Her gynecologic oncologist, Dr. Laurette Schimke recommended Mirena IUD placement.  The patient had a Mirena IUD which was initially placed in October 2011, but this fell out and this IUD was replaced in December 2011.  She is here today for a followup visit.  She does not say that she thinks that her IUD has fallen out as she has not had any bleeding since October with the initial placement, but she does report some pelvic cramping.  She is unable to take NSAIDs because she is on Coumadin and she said Tylenol was not helping with the pain and she wants further analgesia.  She denies any other symptoms.  Bimanual exam was done and IUD strings or any other part of the IUD was not palpated.  There is concern that she might have expelled this IUD. As such, a pelvic ultrasound will be ordered to evaluate and see if the strings may have gone into the cervical canal or in the lower uterine segment.  If there is no IUD visualized, she might have to be just on Megace and the patient does understand this.  For her pelvic cramping, she was given a prescription of Tylenol No. 3 to see if this will help with her pain.  The patient will be called with the results of her pelvic ultrasound and if the IUD is not in place she will be prescribed for Megace, but if it is in place we will continue current management. The patient was also referred to the [facility] webpage on the [facility] website and told to  read about different classes that had and different resources that had as she did indicate that she is having a tough time losing weight.          ______________________________ Jaynie Collins, MD    UA/MEDQ  D:  12/02/2010  T:  12/03/2010  Job:  [id number]

## 2011-01-17 ENCOUNTER — Encounter (INDEPENDENT_AMBULATORY_CARE_PROVIDER_SITE_OTHER): Payer: Self-pay | Admitting: Nurse Practitioner

## 2011-01-17 ENCOUNTER — Encounter: Payer: Self-pay | Admitting: Nurse Practitioner

## 2011-01-17 DIAGNOSIS — E1165 Type 2 diabetes mellitus with hyperglycemia: Secondary | ICD-10-CM

## 2011-01-17 DIAGNOSIS — IMO0001 Reserved for inherently not codable concepts without codable children: Secondary | ICD-10-CM | POA: Insufficient documentation

## 2011-01-17 LAB — CONVERTED CEMR LAB
Bilirubin Urine: NEGATIVE
Glucose, Urine, Semiquant: 500
Ketones, urine, test strip: NEGATIVE
Protein, U semiquant: NEGATIVE
Urobilinogen, UA: 0.2
pH: 6

## 2011-01-18 LAB — CONVERTED CEMR LAB: Microalb, Ur: 2.4 mg/dL — ABNORMAL HIGH (ref 0.00–1.89)

## 2011-01-24 NOTE — Assessment & Plan Note (Signed)
Summary: ER f/u - Diabetes   Vital Signs:  Patient profile:   35 year old female LMP:     11/2010 Weight:      398.1 pounds BMI:     69.66 Temp:     98.3 degrees F oral Pulse rate:   102 / minute Pulse rhythm:   regular Resp:     20 per minute BP sitting:   137 / 86  (left arm) Cuff size:   regular  Vitals Entered By: Levon Hedger (January 17, 2011 12:35 PM)  Nutrition Counseling: Patient's BMI is greater than 25 and therefore counseled on weight management options. CC: hospital f/u Is Patient Diabetic? Yes Pain Assessment Patient in pain? no       Does patient need assistance? Functional Status Self care Ambulation Normal LMP (date): 11/2010     Enter LMP: 11/2010 Last PAP Result NEGATIVE FOR INTRAEPITHELIAL LESIONS OR MALIGNANCY.   CC:  hospital f/u.  History of Present Illness: Pt into the office for ER f/u - she was just dx as a diabetic  Anticoagulation Management History:      The patient is taking warfarin for the first episode of deep venous thrombosis and/or pulmonary embolism.  Positive risk factors for bleeding include presence of serious comorbidities.  Negative risk factors for bleeding include an age less than 79 years old, no history of CVA/TIA, and no history of GI bleeding.  The bleeding index is 'intermediate risk'.  Positive CHADS2 values include History of Diabetes.  Negative CHADS2 values include History of CHF, History of HTN, Age > 65 years old, and Prior Stroke/CVA/TIA.  The CHADS2 risk level is 'Low (CHADS2<2)-Bridging Rx Optional'.  The start date was 04/08/2010.  Anticipated length of treatment is 3-6 months.  Her last INR was 3.10.    Diabetes Management History:      The patient is a 35 years old female who comes in for evaluation of DM Type 2.  She has not been enrolled in the "Diabetic Education Program".  She states understanding of dietary principles and is following her diet appropriately.  No sensory loss is reported.  Self foot exams are  not being performed.  She is not checking home blood sugars.  She says that she is not exercising regularly.        Hypoglycemic symptoms are not occurring.  No hyperglycemic symptoms are reported.        The following changes have been made to her treatment plan since last visit: medication changes.  Treatment plan changes were initiated by patient.     Habits & Providers  Alcohol-Tobacco-Diet     Alcohol drinks/day: 0     Tobacco Status: never  Exercise-Depression-Behavior     Does Patient Exercise: no     Have you felt down or hopeless? no     Have you felt little pleasure in things? no     Depression Counseling: not indicated; screening negative for depression     Drug Use: never  Allergies (verified): 1)  ! Vicodin 2)  ! Morphine  Review of Systems General:  Denies loss of appetite. CV:  Denies chest pain or discomfort. Resp:  Denies cough. GI:  Denies abdominal pain, nausea, and vomiting. MS:  Denies joint pain.  Physical Exam  General:  alert and overweight-appearing.   Head:  normocephalic.   Lungs:  normal breath sounds.   Heart:  normal rate and regular rhythm.   Abdomen:  normal bowel sounds.   Msk:  normal ROM.   Neurologic:  alert & oriented X3.   Skin:  color normal.   Psych:  Oriented X3.     Impression & Recommendations:  Problem # 1:  DIABETES MELLITUS, UNCONTROLLED (ICD-250.02) newly dx will make appt with diabetes educator Her updated medication list for this problem includes:    Metformin Hcl 500 Mg Tabs (Metformin hcl) ..... One tablet by mouth two times a day for blood sugar  Orders: Capillary Blood Glucose/CBG ([address]) UA Dipstick w/o Micro (manual) ([address]) T-Urine Microalbumin w/creat. ratio [phone]) Misc. Referral (Misc. Ref)  Problem # 2:  ELEVATED BLOOD PRESSURE WITHOUT DIAGNOSIS OF HYPERTENSION (ICD-796.2)  Her updated medication list for this problem includes:    Hydrochlorothiazide 25 Mg Tabs (Hydrochlorothiazide) .Marland Kitchen...  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 One tablet by mouth daily 2)  Multivitamins Tabs (Multiple vitamin) .... One tablet by mouth daily 3)  Coumadin 10 Mg Tabs (Warfarin sodium) .... One tablet by mouth daily 4)  Metformin Hcl 500 Mg Tabs (Metformin hcl) .... One tablet by mouth two times a day for blood sugar 5)  Megestrol Acetate 40 Mg Tabs (Megestrol acetate) .... 2 tablets by mouth two times a day  **rx by gyn** 6)  Hydrochlorothiazide 25 Mg Tabs (Hydrochlorothiazide) .... Take one (1) by mouth daily 7)  Wavesense Presto Pro Meter Devi (Blood glucose monitoring suppl) .... Use to check blood sugar daily 8)  Blood Glucose Test Strp (Glucose blood) .... Use to check blood sugar once daily before meals 9)  Lancets Misc (Lancets) .... Use to check blood sugar daily before breakfast  Anticoagulation Management Assessment/Plan:      The patient's current anticoagulation dose is Coumadin 10 mg tabs: One tablet by mouth daily.  The target INR is 2.0-3.0.  The next INR is due 01/23/2011.         Prior Anticoagulation Instructions: Coumadin 10 mg tabs: One tablet by mouth daily 1 1/2 daily on Friday.The patient is to continue with the same dose of coumadin.    Diabetes Management Assessment/Plan:      Her blood pressure goal is < 130/80.    Patient Instructions: 1)  Diabetes  - blood sugar monitor will be sent to [facility] pharmacy.  also you should take metformin 500mg  by mouth two times a day with food. 2)  You will be scheduled an appointment with Susie Piper - diabetes educator 3)  You will need to take coumadin until 03/2011 4)  Follow up in 6-8 weeks for diabetes 5)  will need cbg, u/a, foot check Prescriptions: LANCETS   MISC (LANCETS) Use to check blood sugar daily before breakfast  #50 x 11   Entered and Authorized by:   Lehman Prom FNP   Signed by:   Lehman Prom FNP on 01/18/2011   Method used:   Faxed to ...       [facility] (retail)       [address] [place], [place]  [address]       Ph: [phone]       Fax: [phone]   RxID:   [id number] BLOOD GLUCOSE TEST  STRP (GLUCOSE BLOOD) Use to  check blood sugar once daily before meals  #50 x 11   Entered and Authorized by:   Lehman Prom FNP   Signed by:   Lehman Prom FNP on 01/18/2011   Method used:   Faxed to ...       [facility] (retail)       [address] [place], [place]  [address]       Ph: [phone]       Fax: [phone]   RxID:   [id number] WAVESENSE PRESTO PRO METER  DEVI (BLOOD GLUCOSE MONITORING SUPPL) Use to check blood sugar daily  #1 meter x 0   Entered and Authorized by:   Lehman Prom FNP   Signed by:   Lehman Prom FNP on 01/18/2011   Method used:   Faxed to ...       [facility] (retail)       [address] [place], [place]  [address]       Ph: [phone]       Fax: [phone]   RxID:   [id number] METFORMIN HCL 500 MG TABS (METFORMIN HCL) One tablet by mouth two times a day for blood sugar  #60 x 11   Entered and Authorized by:   Lehman Prom FNP   Signed by:   Lehman Prom FNP on 01/18/2011   Method used:   Faxed to ...       [facility] (retail)       [address] [place], [place]  [address]       Ph: [phone]       Fax: [phone]   RxID:   [id number] HYDROCHLOROTHIAZIDE 25 MG TABS (HYDROCHLOROTHIAZIDE) Take one (1) by mouth daily  #30 x 5   Entered and Authorized by:   Lehman Prom FNP   Signed by:   Lehman Prom FNP on 01/17/2011   Method used:   Print then Give  to Patient   RxID:   [id number]    Orders Added: 1)  Est. Patient Level IV [48546] 2)  Capillary Blood Glucose/CBG [82948] 3)  UA Dipstick w/o Micro (manual) [81002] 4)  T-Urine Microalbumin w/creat. ratio [[id number]] 5)  Misc. Referral [Misc. Ref]    Laboratory Results   Urine Tests  Date/Time Received: January 17, 2011 1:15 PM   Routine Urinalysis   Color: yellow Appearance: Clear Glucose: 500   (Normal Range: Negative) Bilirubin: negative   (Normal Range: Negative) Ketone: negative   (Normal Range: Negative) Spec. Gravity: 1.025   (Normal Range: 1.003-1.035) Blood: negative   (Normal Range: Negative) pH: 6.0   (Normal Range: 5.0-8.0) Protein: negative   (Normal Range: Negative) Urobilinogen: 0.2   (Normal Range: 0-1) Nitrite: negative   (Normal Range: Negative) Leukocyte Esterace: negative   (Normal Range: Negative)         CT of Abdomen  Procedure date:  01/01/2011  Findings:      no acute abnormalities identified within  the abdomen or pelvis. small umbilical hernia noted, containing only fat

## 2011-01-26 ENCOUNTER — Other Ambulatory Visit (HOSPITAL_COMMUNITY)
Admission: RE | Admit: 2011-01-26 | Discharge: 2011-01-26 | Disposition: A | Discharge status: Home / Self Care | Payer: Self-pay | Source: Ambulatory Visit | Attending: Obstetrics and Gynecology | Admitting: Obstetrics and Gynecology

## 2011-01-26 ENCOUNTER — Ambulatory Visit: Payer: Self-pay | Admitting: Obstetrics & Gynecology

## 2011-01-26 ENCOUNTER — Other Ambulatory Visit: Payer: Self-pay | Admitting: Obstetrics & Gynecology

## 2011-01-26 DIAGNOSIS — Z8542 Personal history of malignant neoplasm of other parts of uterus: Secondary | ICD-10-CM | POA: Insufficient documentation

## 2011-01-26 DIAGNOSIS — C549 Malignant neoplasm of corpus uteri, unspecified: Secondary | ICD-10-CM

## 2011-01-26 DIAGNOSIS — R87619 Unspecified abnormal cytological findings in specimens from cervix uteri: Secondary | ICD-10-CM | POA: Insufficient documentation

## 2011-01-26 LAB — POCT PREGNANCY, URINE: Preg Test, Ur: NEGATIVE

## 2011-01-27 NOTE — Progress Notes (Signed)
NAME:  Chelsey Clark, Chelsey Clark               ACCOUNT NO.:  [id number]  MEDICAL RECORD NO.:  [id number]           PATIENT TYPE:  A  LOCATION:  [facility]                   FACILITY:  [facility]  PHYSICIAN:  Jaynie Collins, MD     DATE OF BIRTH:  09-Jun-1976  DATE OF SERVICE:  01/26/2011                                 CLINIC NOTE  REASON FOR VISIT:  The patient is here for endometrial sampling in the setting of inoperable endometrial cancer.  The patient is a 35 year old, nulligravida patient with endometrioid adenocarcinoma FIGO grade 1 that was diagnosed in a D and C day done in June 2011.  The patient does have a history of morbid obesity and is inoperable because of this.  The recommendation was for a Mirena IUD and she has had 2 placements of this Mirena IUD and both were expelled. Currently, she is on oral progestin therapy while she loses weight and becomes a candidate for surgical management.  As part of her surveillance, the patient does need endometrial sampling every 3 months.  Since her last visit, the patient has been seen in the emergency room a couple of times for back pain and one of the visits was remarkable for having significant glucosuria and also a serum glucose level of 300. She was subsequently diagnosed with type 2 diabetes and has been started on metformin for this.  Of note, the patient is on lisinopril and hydrochlorothiazide for hypertension and also on Coumadin because of a history of deep venous thrombosis that she had while she was on oral contraceptive pills that were given to her to manage her dysfunctional uterine bleeding prior to her diagnosis of endometrial cancer.  The patient has no other medical problems.  Endometrial biopsy:  The patient was counseled regarding needing an endometrial biopsy and the risks were explained.  A urine pregnancy test that was done was negative.  A vaginal speculum was placed.  Her cervix was visualized and soaked with Betadine.  A  tenaculum was placed in the anterior lip of the cervix and a 3-mm Pipelle was introduced into the uterine cavity to a depth of 10 cm.  The suction was activated on the Pipelle and this was rotated to obtain a moderate amount of tissue.  A second pass was made.  All of the endometrial tissue was sent to Pathology for further analysis.  The patient did have some nausea and emesis after the biopsy and had minimal bleeding.  Otherwise tolerated the procedure well.  ASSESSMENT/PLAN:  The patient is a 35 year old with known inoperable endometrial cancer who is here for surveillance endometrial sampling. We will follow up the results.  The patient is going to continue follow up with Dr. Laurette Schimke at [facility] and to further discuss surgical management when she becomes an appropriate candidate. She will also follow up with her primary care physician for management of her diabetes and hypertension and also for ongoing anticoagulation. For her treatment currently, she was given another prescription for Megace 80 mg twice a day and was given a year's refill of this.  She was also given Tylenol No. 3 to help with any  pain.  She was told to call or come back in for any further gynecologic concerns.  The patient will be called in 2 weeks with the results of her endometrial biopsy.         ______________________________ Jaynie Collins, MD   UA/MEDQ  D:  01/26/2011  T:  01/27/2011  Job:  [id number]

## 2011-02-11 ENCOUNTER — Emergency Department (HOSPITAL_COMMUNITY)
Admission: EM | Admit: 2011-02-11 | Discharge: 2011-02-12 | Disposition: A | Discharge status: Home / Self Care | Payer: Self-pay | Attending: Emergency Medicine | Admitting: Emergency Medicine

## 2011-02-11 DIAGNOSIS — E119 Type 2 diabetes mellitus without complications: Secondary | ICD-10-CM | POA: Insufficient documentation

## 2011-02-11 DIAGNOSIS — R109 Unspecified abdominal pain: Secondary | ICD-10-CM | POA: Insufficient documentation

## 2011-02-11 DIAGNOSIS — D649 Anemia, unspecified: Secondary | ICD-10-CM | POA: Insufficient documentation

## 2011-02-11 DIAGNOSIS — G8929 Other chronic pain: Secondary | ICD-10-CM | POA: Insufficient documentation

## 2011-02-11 DIAGNOSIS — Z86718 Personal history of other venous thrombosis and embolism: Secondary | ICD-10-CM | POA: Insufficient documentation

## 2011-02-11 DIAGNOSIS — R300 Dysuria: Secondary | ICD-10-CM | POA: Insufficient documentation

## 2011-02-11 LAB — DIFFERENTIAL
Basophils Relative: 0 % (ref 0–1)
Eosinophils Relative: 1 % (ref 0–5)
Lymphocytes Relative: 33 % (ref 12–46)
Lymphs Abs: 3.5 10*3/uL (ref 0.7–4.0)
Monocytes Absolute: 0.7 10*3/uL (ref 0.1–1.0)
Monocytes Relative: 7 % (ref 3–12)
Neutro Abs: 6.3 10*3/uL (ref 1.7–7.7)
Neutrophils Relative %: 59 % (ref 43–77)

## 2011-02-11 LAB — BASIC METABOLIC PANEL
GFR calc non Af Amer: >60 mL/min (ref >60)
Glucose, Bld: 204 mg/dL — ABNORMAL HIGH (ref 70–99)
Potassium: 3.9 mEq/L (ref 3.5–5.1)
Sodium: 138 mEq/L (ref 135–145)

## 2011-02-11 LAB — CBC
HCT: 30.1 % — ABNORMAL LOW (ref 36.0–46.0)
MCH: 21.4 pg — ABNORMAL LOW (ref 26.0–34.0)
MCHC: 30.9 g/dL (ref 30.0–36.0)
MCV: 69.4 fL — ABNORMAL LOW (ref 78.0–100.0)
RDW: 21 % — ABNORMAL HIGH (ref 11.5–15.5)

## 2011-02-11 LAB — URINALYSIS, ROUTINE W REFLEX MICROSCOPIC
Bilirubin Urine: NEGATIVE
Nitrite: NEGATIVE
Specific Gravity, Urine: 1.029 (ref 1.005–1.030)
Urobilinogen, UA: 1 mg/dL (ref 0.0–1.0)
pH: 6.5 (ref 5.0–8.0)

## 2011-02-12 ENCOUNTER — Emergency Department: Payer: Self-pay | Admitting: Emergency Medicine

## 2011-04-30 IMAGING — US US PELV - US TRANSVAGINAL
1 series · 13 of 25 positions shown · non-contrast
Comparison: None.

CLINICAL DATA: Menorrhagia; unable to locate IUD strings; history
of abnormal D & C



[Series 1: us pelvis complete + us transvaginal non-ob · 13 of 43 slices shown]
[im 1/43]
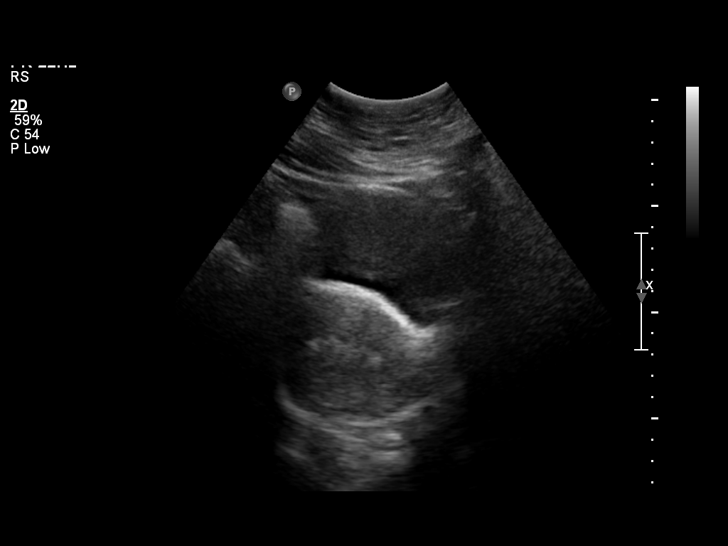
[im 4/43]
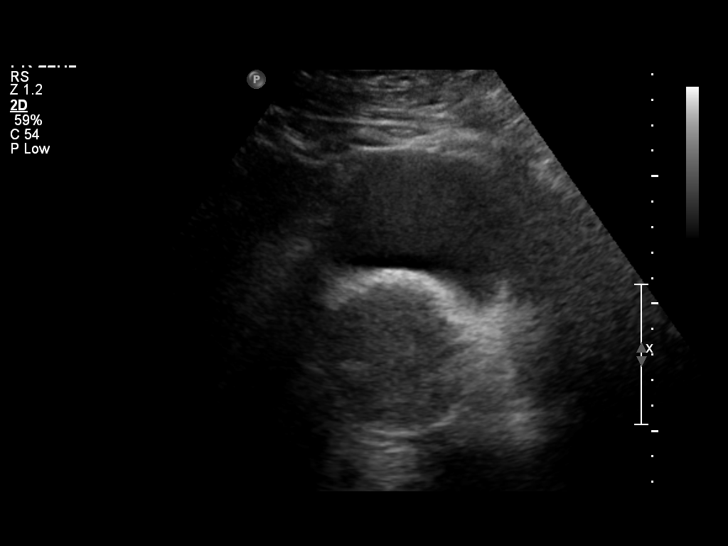
[im 8/43]
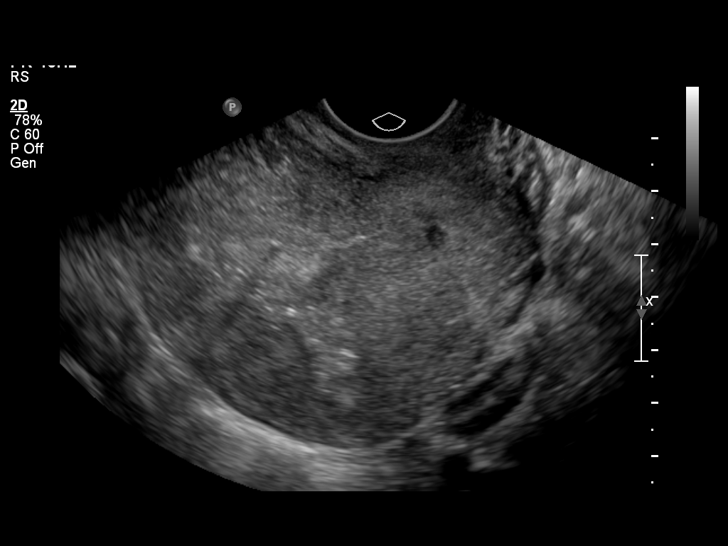
[im 11/43]
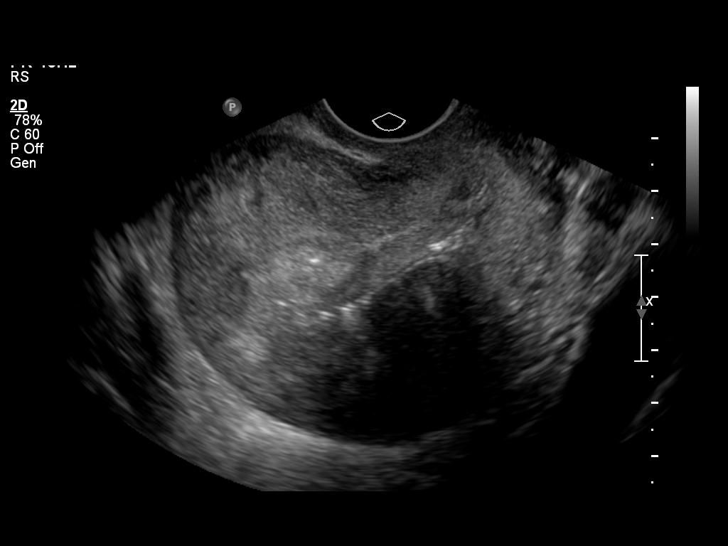
[im 15/43]
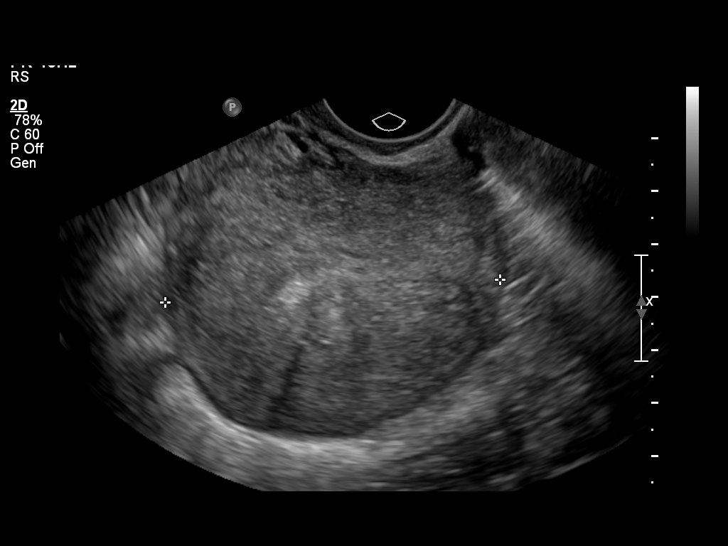
[im 18/43]
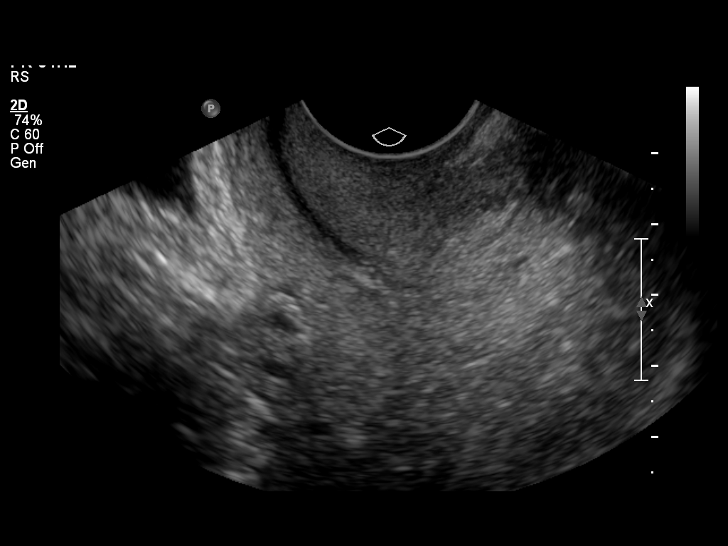
[im 22/43]
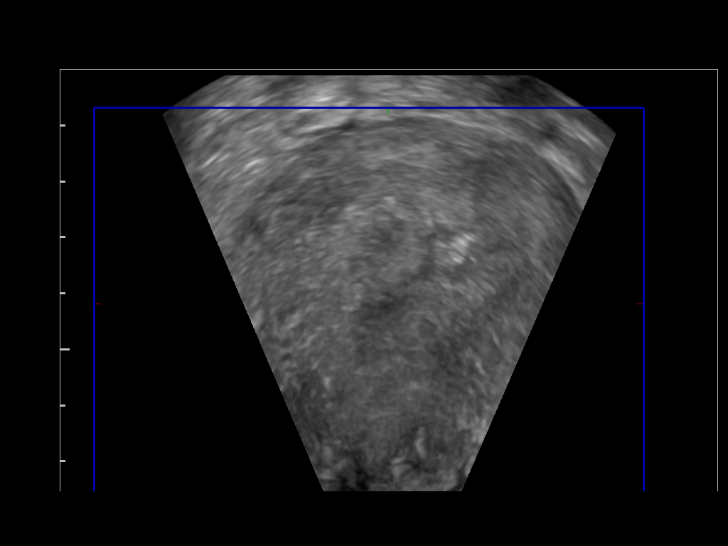
[im 25/43]
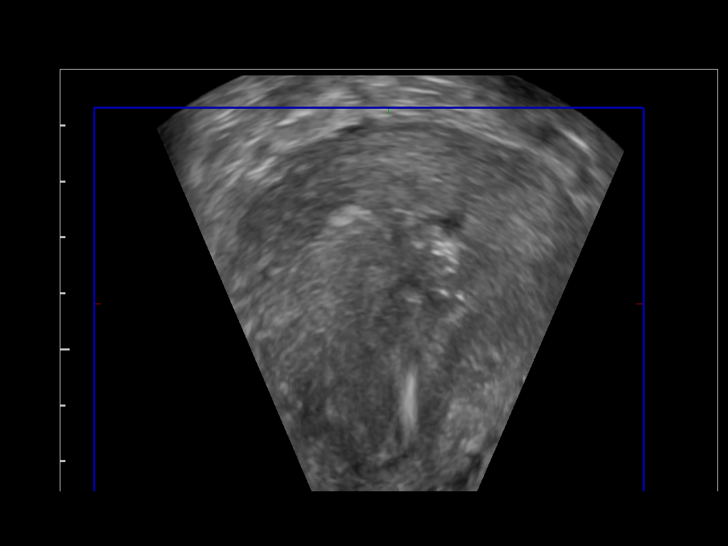
[im 29/43]
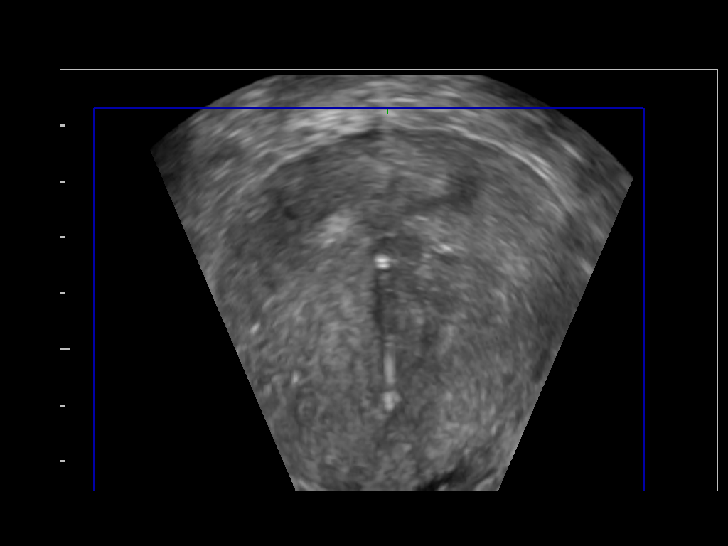
[im 32/43]
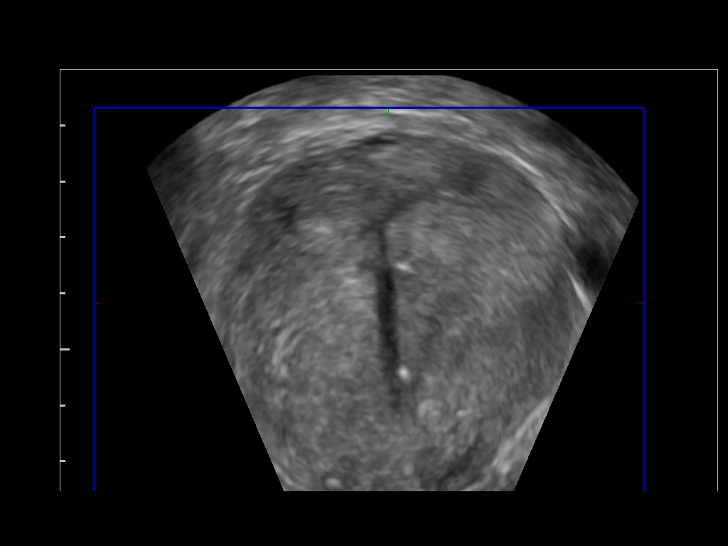
[im 36/43]
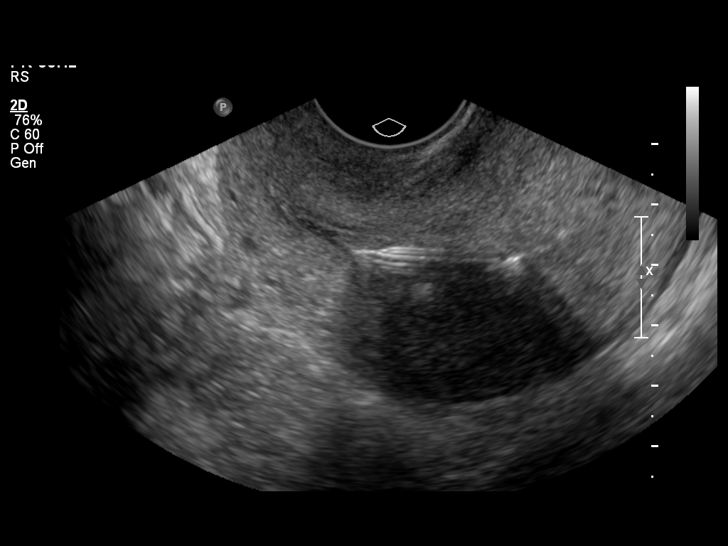
[im 39/43]
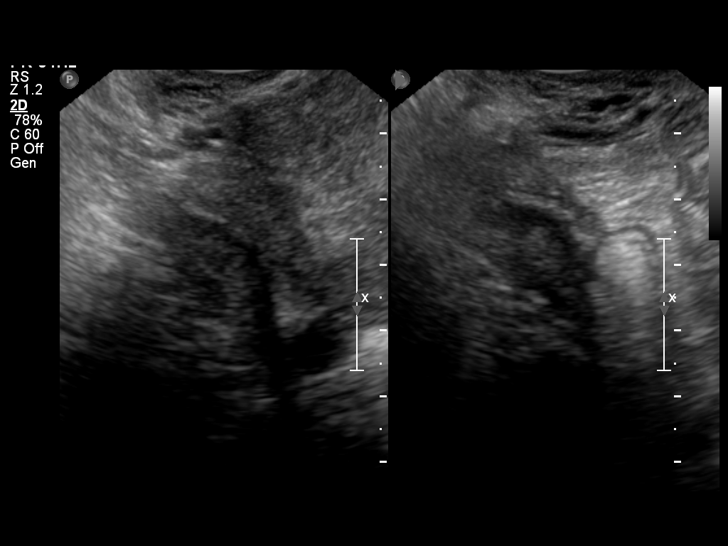
[im 43/43]
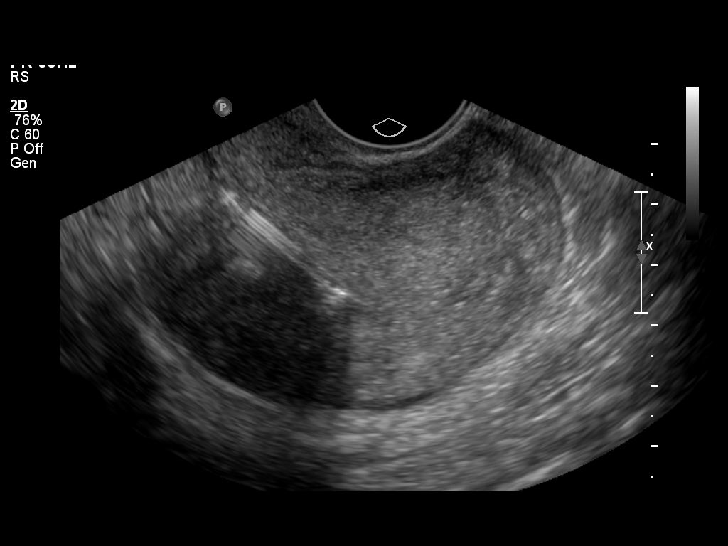

[13 of 25 positions shown; findings below may reference images not displayed]

FINDINGS: The uterus is normal in size and echogenicity, measuring
6.9 x 5.6 x 6.3 cm.  The IUD is located within the endometrium at
the fundus, in proper position.  The endometrial stripe is
heterogeneous and thickened, measuring approximately 14 mm in
width.

Both ovaries have a normal size and appearance.  The right ovary
measures 2.8 x 1.0 x 1.6 cm, and the left ovary measures 2.1 by
x 1.1 cm.  There are no adnexal masses or free pelvic fluid.
IMPRESSION: The IUD is in the proper location, at the fundus within the
endometrial cavity.

The endometrial stripe is heterogeneous and mildly thickened for a
patient this age, measuring 14 mm.  Though this may be physiologic,
as the patient is premenopausal, please correlate with prior D&C
results.  Follow-up ultrasound in 6-8 weeks could be performed to
document stability / resolution.

## 2011-05-19 ENCOUNTER — Inpatient Hospital Stay (INDEPENDENT_AMBULATORY_CARE_PROVIDER_SITE_OTHER)
Admission: RE | Admit: 2011-05-19 | Discharge: 2011-05-19 | Disposition: A | Discharge status: Home / Self Care | Payer: Self-pay | Source: Ambulatory Visit | Attending: Family Medicine | Admitting: Family Medicine

## 2011-05-19 DIAGNOSIS — K029 Dental caries, unspecified: Secondary | ICD-10-CM

## 2011-05-26 IMAGING — CT CT ABD-PELV W/O CM
3 of 4 series · 15 of 32 positions shown, 19 images · non-contrast
Comparison: CT of the abdomen and pelvis performed 02/03/2010

CLINICAL DATA: Left flank pain and abdominal pain; nausea.

CT ABDOMEN AND PELVIS WITHOUT CONTRAST
TECHNIQUE: Multidetector CT imaging of the abdomen and pelvis was
performed following the standard protocol without intravenous
contrast.

[Series 2: renal stone · axial · 0.97mm/px · z∈[-460,-220]mm · 3 of 98 slices shown, 7 images]
[im 25/98  soft-tissue]
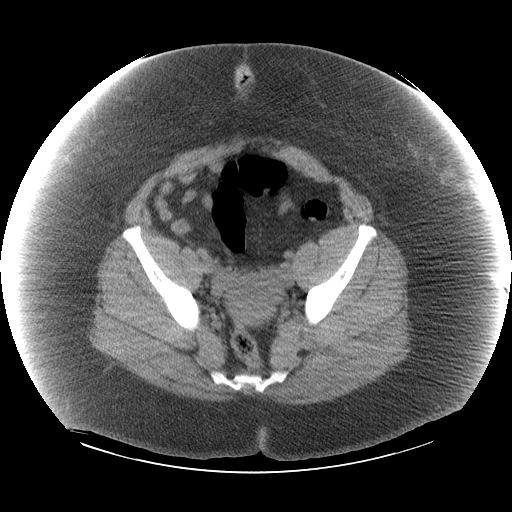
[im 25/98  lung]
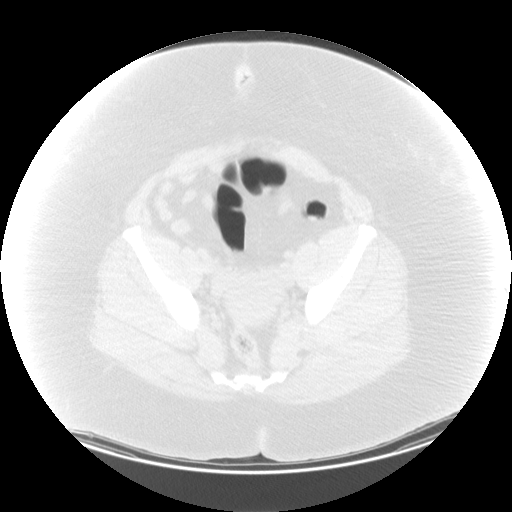
[im 25/98  bone]
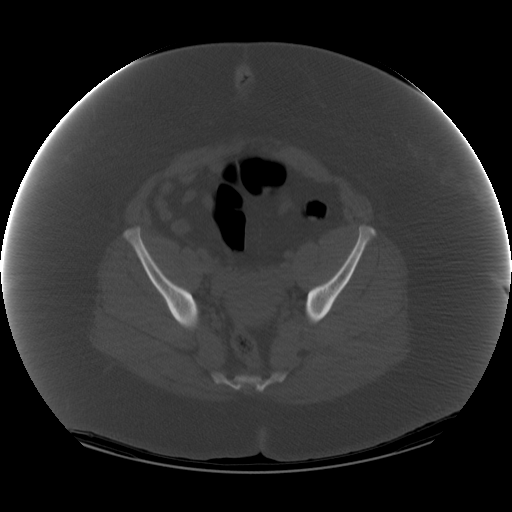
[im 49/98  soft-tissue]
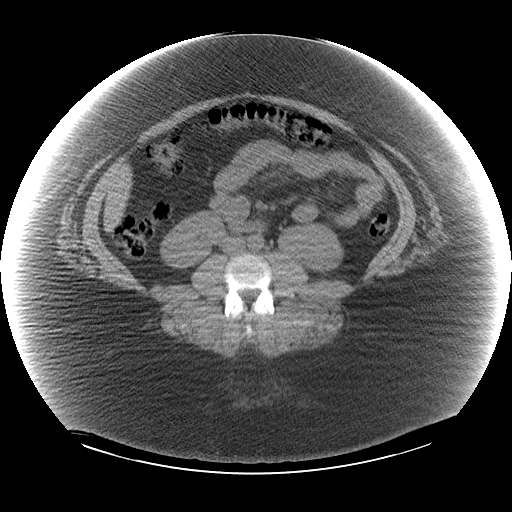
[im 49/98  lung]
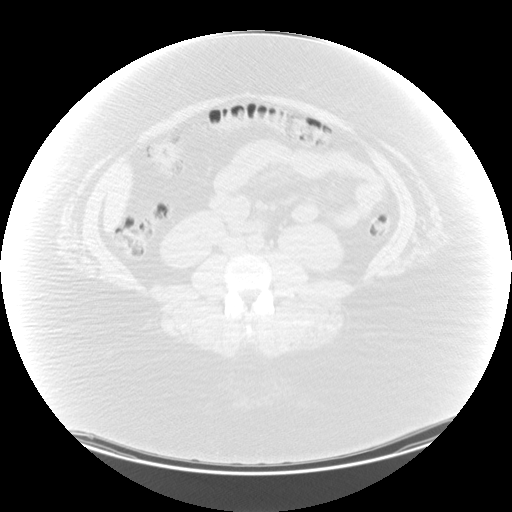
[im 73/98  soft-tissue]
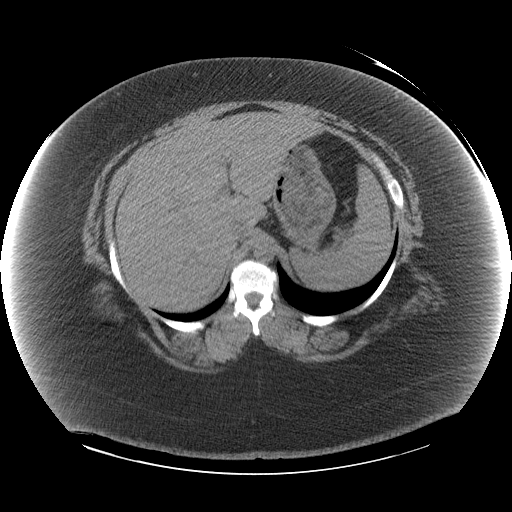
[im 73/98  lung]
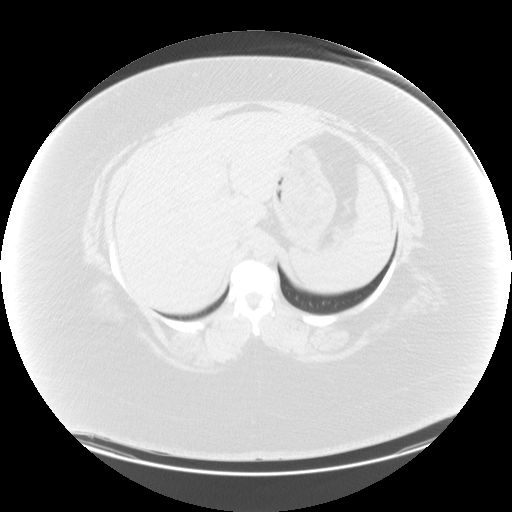

[Series 400: reformatted · coronal · 0.97mm/px · 4 of 220 slices shown (1 of 2)]
[im 20/220  soft-tissue]
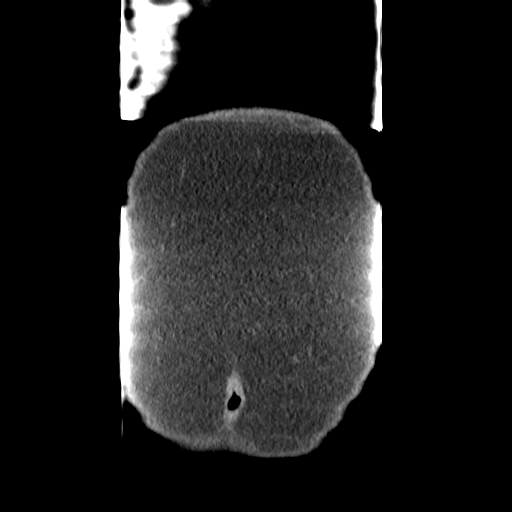
[im 40/220  soft-tissue]
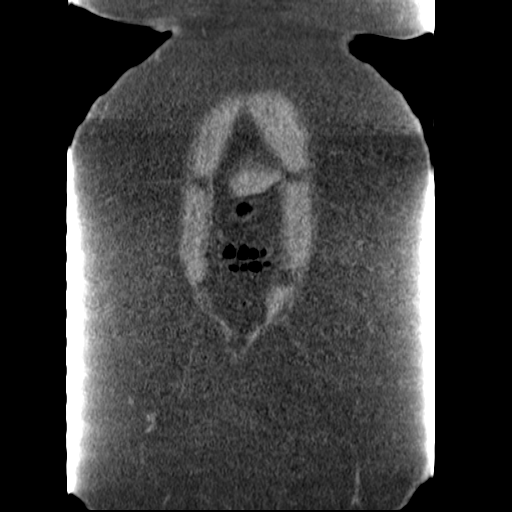
[im 80/220  soft-tissue]
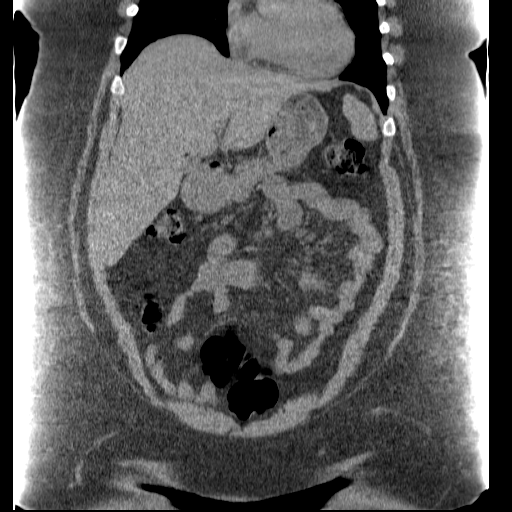
[im 100/220  soft-tissue]
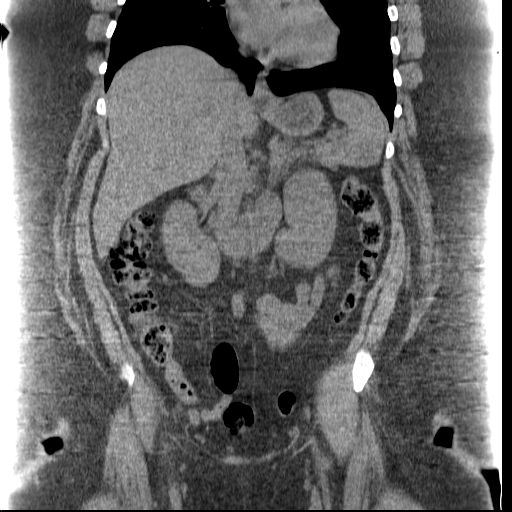

[Series 401: reformatted · sagittal · 0.97mm/px · 8 of 242 slices shown (2 of 2)]
[im 21/242  soft-tissue]
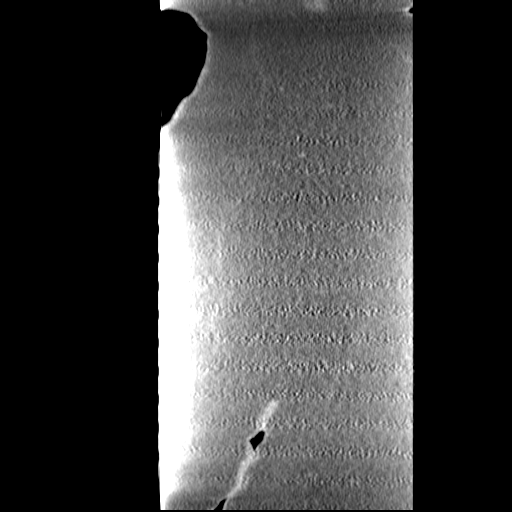
[im 61/242  soft-tissue]
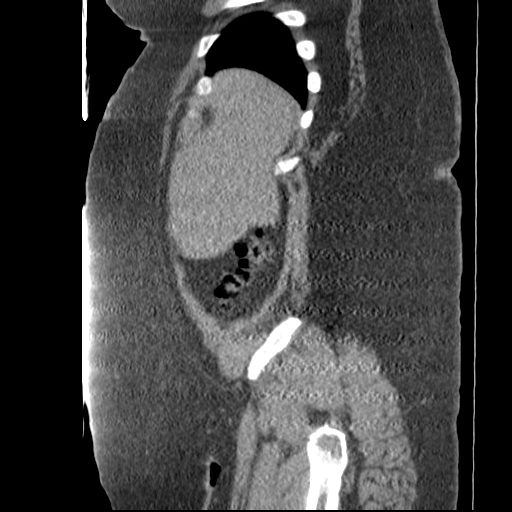
[im 81/242  soft-tissue]
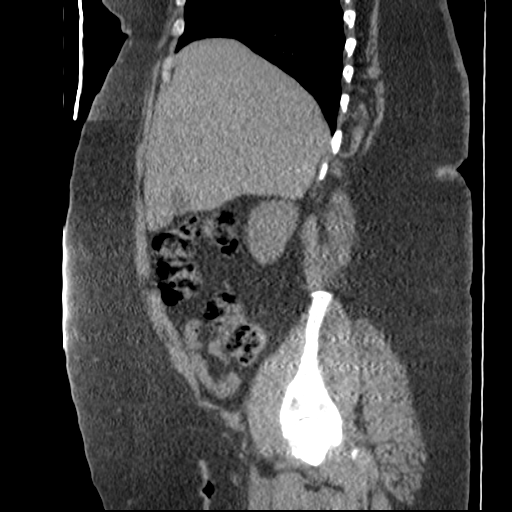
[im 101/242  soft-tissue]
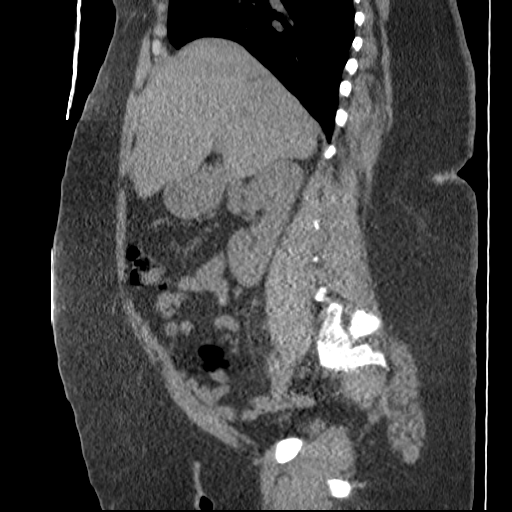
[im 141/242  soft-tissue]
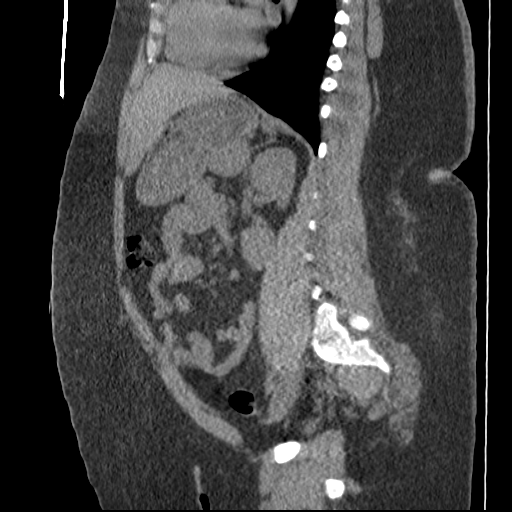
[im 161/242  soft-tissue]
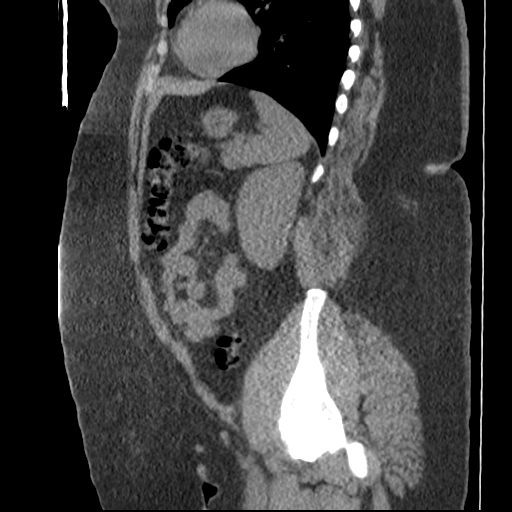
[im 181/242  soft-tissue]
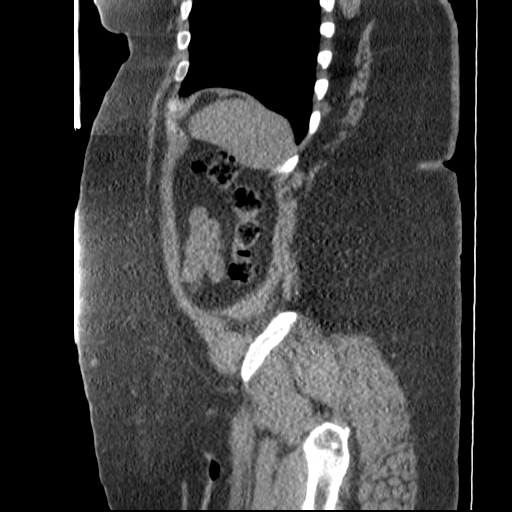
[im 221/242  soft-tissue]
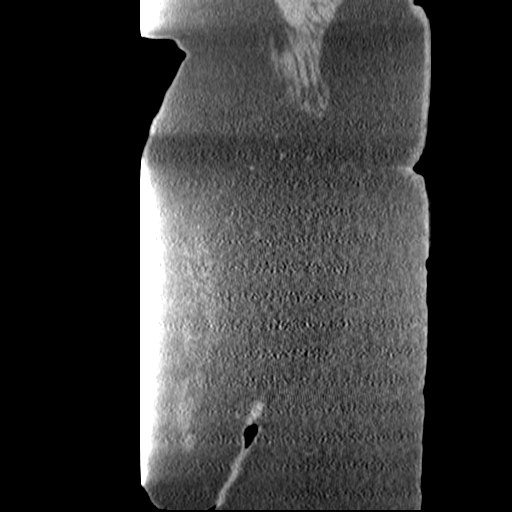

[15 of 32 positions shown; findings below may reference images not displayed]

FINDINGS: The visualized lung bases are clear.

The liver and spleen are unremarkable in appearance.  The
gallbladder is within normal limits.  The pancreas and adrenal
glands are unremarkable.

The kidneys are unremarkable in appearance.  There is no evidence
of hydronephrosis.  No renal or ureteral stones are identified.  No
perinephric stranding is seen.

No free fluid is identified.  The small bowel is unremarkable in
appearance.  The stomach is within normal limits.  No acute
vascular abnormalities are seen. A small umbilical hernia is noted,
containing only fat.

The appendix is difficult to fully characterize due to artifact
from the patient's habitus, but appears normal in caliber, without
evidence for appendicitis. The colon is unremarkable in appearance.

The bladder is largely decompressed and appears grossly
unremarkable.  The uterus is difficult to fully characterize, but
appears grossly unremarkable; no suspicious adnexal masses are
seen.  No inguinal lymphadenopathy is seen.

No acute osseous abnormalities are identified.
IMPRESSION: 1.  No acute abnormalities identified within the abdomen or pelvis.
2.  Small umbilical hernia noted, containing only fat.

## 2011-10-05 ENCOUNTER — Ambulatory Visit (INDEPENDENT_AMBULATORY_CARE_PROVIDER_SITE_OTHER): Payer: 59 | Admitting: Obstetrics & Gynecology

## 2011-10-05 ENCOUNTER — Other Ambulatory Visit (HOSPITAL_COMMUNITY)
Admission: RE | Admit: 2011-10-05 | Discharge: 2011-10-05 | Disposition: A | Discharge status: Home / Self Care | Payer: 59 | Source: Ambulatory Visit | Attending: Obstetrics & Gynecology | Admitting: Obstetrics & Gynecology

## 2011-10-05 DIAGNOSIS — C549 Malignant neoplasm of corpus uteri, unspecified: Secondary | ICD-10-CM

## 2011-10-05 DIAGNOSIS — E119 Type 2 diabetes mellitus without complications: Secondary | ICD-10-CM

## 2011-10-05 DIAGNOSIS — G4700 Insomnia, unspecified: Secondary | ICD-10-CM

## 2011-10-05 DIAGNOSIS — Z01812 Encounter for preprocedural laboratory examination: Secondary | ICD-10-CM

## 2011-10-05 DIAGNOSIS — C541 Malignant neoplasm of endometrium: Secondary | ICD-10-CM

## 2011-10-05 DIAGNOSIS — Z01419 Encounter for gynecological examination (general) (routine) without abnormal findings: Secondary | ICD-10-CM

## 2011-10-05 MED ORDER — ZOLPIDEM TARTRATE 5 MG PO TABS: 5.0000 mg | ORAL_TABLET | Freq: Every evening | ORAL | Status: DC | PRN

## 2011-10-05 MED ORDER — MEGESTROL ACETATE 40 MG PO TABS: 40.0000 mg | ORAL_TABLET | Freq: Two times a day (BID) | ORAL | Status: DC

## 2011-10-05 NOTE — Patient Instructions (Signed)
Return to clinic for any gynecologic concerns or as scheduled.

## 2011-10-05 NOTE — Progress Notes (Signed)
Pt has not taken BP med x 1 week

## 2011-10-05 NOTE — Progress Notes (Signed)
  Subjective:     Chelsey Clark is a 35 y.o.G0  female here for endometrial sampling in the setting of inoperable endometrial cancer. She has endometrioid adenocarcinoma FIGO grade 1 that was diagnosed in a D and C done in June 2011.  The patient does have a history of morbid obesity and is inoperable because of this.  The recommendation was for a Mirena IUD and she has had 2 placements of this Mirena IUD and both were expelled. Currently, she is on oral progestin therapy  (Megace 80 mg po bid) while she loses weight and becomes a candidate for surgical management.  As part of her surveillance, the patient does need endometrial sampling every 6 months.  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 - BENIGN ENDOCERVICAL MUCOSA. - NEGATIVE FOR HYPERPLASIA OR MALIGNANCY.  Patient has not had a pap smear since 02/2010, she desires one today.  She is followed by her PCP for treatment of DM and CHTN.  The following portions of the patient's history were reviewed and updated as appropriate: allergies, current medications, past family history, past medical history, past social history, past surgical history and problem list.  Review of Systems A comprehensive review of systems was negative.    Objective:  Blood pressure 165/97, pulse 88, temperature 97.1 F (36.2 C), temperature source Oral, height 5\' 4"  (1.626 m), weight 395 lb 1.6 oz (179.216 kg).  GENERAL: Well-developed, well-nourished female in no acute distress.  HEENT: Normocephalic, atraumatic. Sclerae anicteric.  NECK: Supple. Normal thyroid.  LUNGS: Clear to auscultation bilaterally.  HEART: Regular rate and rhythm. BREASTS: Pendulous, symmetric with everted nipples. No masses, skin changes, nipple drainage, or lymphadenopathy. ABDOMEN: Soft, obese, nontender, nondistended. No organomegaly. PELVIC: Normal external female genitalia.  Vagina is pink and rugated.  Normal discharge. Normal cervix contour. Pap smear obtained. Bimanual was inadequate secondary to habitus. EXTREMITIES: No cyanosis, clubbing, or edema, 2+ distal pulses.  Endometrial biopsy:  The patient was counseled regarding needing an endometrial biopsy and the risks were explained.  Her cervix was cleanedwith Betadine.  A tenaculum was placed in the anterior lip of the cervix and a 3-mm Pipelle was introduced into the uterine cavity to a depth of 10 cm.  The suction was activated on the Pipelle and this was rotated to obtain a moderate amount of tissue.   All of the endometrial tissue was sent to Pathology for further analysis.  The patient did have some nausea and emesis after the biopsy and had minimal bleeding.  Otherwise tolerated the procedure well.  Assessment:    Annual exam and endometrial sampling for patient with inoperable endometrial cancer 2/2 morbid obesity.    Plan:   We will follow up the results of the pap and endometrial biopsy. Pelvic ultrasound also ordered for further surveillance.  Will forward note to Dr. Laurette Schimke at [facility] to ascertain if there is need for any further studies at this time. She will also follow up with her primary care physician for management of her diabetes and hypertension. She was given another prescription for Megace 80 mg twice a day and was given five refills.  She was also given Ambien to help with insomnia.  She was told to call or come back in for any further gynecologic concerns and for her scheduled endometrial biopsy in six months.  The patient will be called with the results of her endometrial biopsy.

## 2011-10-20 ENCOUNTER — Ambulatory Visit (HOSPITAL_COMMUNITY)
Admission: RE | Admit: 2011-10-20 | Discharge: 2011-10-20 | Disposition: A | Discharge status: Home / Self Care | Payer: 59 | Source: Ambulatory Visit | Attending: Obstetrics & Gynecology | Admitting: Obstetrics & Gynecology

## 2011-10-20 DIAGNOSIS — C541 Malignant neoplasm of endometrium: Secondary | ICD-10-CM

## 2011-10-20 DIAGNOSIS — C549 Malignant neoplasm of corpus uteri, unspecified: Secondary | ICD-10-CM | POA: Insufficient documentation

## 2011-10-25 ENCOUNTER — Telehealth: Payer: Self-pay | Admitting: *Deleted

## 2011-10-25 DIAGNOSIS — B373 Candidiasis of vulva and vagina: Secondary | ICD-10-CM

## 2011-10-25 MED ORDER — FLUCONAZOLE 150 MG PO TABS: 150.0000 mg | ORAL_TABLET | Freq: Once | ORAL | Status: AC

## 2011-10-25 NOTE — Telephone Encounter (Signed)
Pt called requesting that Dr Macon Large send her a prescription for Diflucan. Over the past few days she has been experiencing yeast like symptoms and itching. Pharmacy is on file.

## 2011-10-25 NOTE — Telephone Encounter (Signed)
Pt notified. Will pick up rx and call us if she needs any further treatment.

## 2011-10-25 NOTE — Telephone Encounter (Signed)
Please call patient, Diflucan e-prescribed as per her request.  Encourage her to come in for evaluation if symptoms do not improve or worsen.

## 2012-02-11 ENCOUNTER — Other Ambulatory Visit: Payer: Self-pay | Admitting: Obstetrics & Gynecology

## 2012-02-11 DIAGNOSIS — C541 Malignant neoplasm of endometrium: Secondary | ICD-10-CM

## 2012-02-15 ENCOUNTER — Telehealth: Payer: Self-pay | Admitting: Obstetrics and Gynecology

## 2012-02-15 NOTE — Telephone Encounter (Signed)
Patient called requesting a call back in regard of her Rx Megace dosages. Returned call and no answer; left message to call us back.

## 2012-02-19 NOTE — Telephone Encounter (Signed)
Called pt and left message that this is our second attempt to return our call to the clinics if she has any further questions.

## 2012-11-28 ENCOUNTER — Encounter: Payer: Self-pay | Admitting: Obstetrics & Gynecology

## 2012-11-28 ENCOUNTER — Ambulatory Visit (INDEPENDENT_AMBULATORY_CARE_PROVIDER_SITE_OTHER): Payer: BC Managed Care – PPO | Admitting: Obstetrics & Gynecology

## 2012-11-28 VITALS — BP 160/82 | HR 112 | Ht 63.5 in | Wt 384.0 lb

## 2012-11-28 DIAGNOSIS — C541 Malignant neoplasm of endometrium: Secondary | ICD-10-CM

## 2012-11-28 DIAGNOSIS — Z01419 Encounter for gynecological examination (general) (routine) without abnormal findings: Secondary | ICD-10-CM

## 2012-11-28 DIAGNOSIS — C549 Malignant neoplasm of corpus uteri, unspecified: Secondary | ICD-10-CM

## 2012-11-28 LAB — POCT PREGNANCY, URINE: Preg Test, Ur: NEGATIVE

## 2012-11-28 NOTE — Patient Instructions (Signed)
Return to clinic for any scheduled appointments or for any gynecologic concerns as needed.   

## 2012-11-29 NOTE — Progress Notes (Signed)
Subjective:     Chelsey Clark is a 37 y.o. G0  female with inoperable endometrioid adenocarcinoma treated with Megace, here today for annual exam and surveillance endometrial biopsy.  Patient was last seen here in December 2012 and has not followed up since then; she had benign pap smear and endometrial biopsy at that visit.  She has been taking Megace 80 mg daily, denies any symptoms.  She has not seen her oncologist for over one year; the recommendation was to get an endometrial biopsy every six months for surveillance of her endometrial cancer.  She is not sexually active, has not been since 2009.  Denies any GYN symptoms.  History   Social History  . Marital Status: Single    Spouse Name: N/A    Number of Children: N/A  . Years of Education: N/A   Occupational History  . Not on file.   Social History Main Topics  . Smoking status: Never Smoker   . Smokeless tobacco: Never Used  . Alcohol Use: No  . Drug Use: No  . Sexually Active: Not Currently   Other Topics Concern  . Not on file   Social History Narrative  . No narrative on file   Health Maintenance  Topic Date Due  . Tetanus/tdap  06/29/1996  . Influenza Vaccine  06/23/2012  . Pap Smear  10/04/2014    The following portions of the patient's history were reviewed and updated as appropriate: allergies, current medications, past family history, past medical history, past social history, past surgical history and problem list.  Review of Systems A comprehensive review of systems was negative.   Objective:   BP 160/82  Pulse 112  Ht 5' 3.5" (1.613 m)  Wt 384 lb (174.181 kg)  BMI 66.96 kg/m2 GENERAL: Well-developed, morbidly obese, well-nourished female in no acute distress.  HEENT: Normocephalic, atraumatic. Sclerae anicteric.  NECK: Supple. Normal thyroid.  LUNGS: Clear to auscultation bilaterally.  HEART: Regular rate and rhythm. BREASTS: Symmetric, pendulous breasts. No masses, skin changes, nipple drainage,  or lymphadenopathy. ABDOMEN: Soft, obese, nontender, nondistended. No organomegaly. PELVIC: Normal external female genitalia. Vagina is pink and rugated.  Normal discharge. Normal cervix contour. Pap smear obtained. Uterus and adnexa unable to be palpated secondary to habitus EXTREMITIES: No cyanosis, clubbing, or edema, 2+ distal pulses.  ENDOMETRIAL BIOPSY     The indications for endometrial biopsy were reviewed.   Risks of the biopsy including cramping, bleeding, infection, uterine perforation, inadequate specimen and need for additional procedures  were discussed. The patient states she understands and agrees to undergo procedure today. Consent was signed. Time out was performed. Urine HCG was negative. After the pap smear was done, the cervix was prepped with Betadine. A single-toothed tenaculum was placed on the anterior lip of the cervix to stabilize it. The 3 mm pipelle was introduced into the endometrial cavity without difficulty to a depth of 10 cm, and a moderate amount of tissue was obtained and sent to pathology. The instruments were removed from the patient's vagina. Minimal bleeding from the cervix was noted. The patient tolerated the procedure well.    Assessment:    Annual female exam Endometrial cancer surveillance via endometrial biopsy     Plan:  Follow up pap smear Routine post-procedure instructions were given to the patient.  The patient will be called with the results of the endometrial biopsy and will be given recommendations for further management.   Results will be forwarded to GYN [facility] (Dr. Nelly Rout); will follow  her recommendations Stressed importance of endometrial biopsy every six months for surveillance; and continued efforts to lose weight (lost 11 pounds compared to last visit).

## 2012-12-03 ENCOUNTER — Encounter: Payer: Self-pay | Admitting: Obstetrics & Gynecology

## 2012-12-10 ENCOUNTER — Telehealth: Payer: Self-pay

## 2012-12-10 ENCOUNTER — Encounter: Payer: Self-pay | Admitting: *Deleted

## 2012-12-10 NOTE — Telephone Encounter (Signed)
Called pt and informed pt of abnormal pap results and the need to for colposcopy.  I informed pt of what colposcopy is and pt did not have any further questions.  I informed pt of appt scheduled on 12/20/12 @ 1015am.  Pt stated understanding.

## 2012-12-10 NOTE — Telephone Encounter (Signed)
Message copied by Faythe Casa on Tue Dec 10, 2012  1:54 PM ------      Message from: Odelia Gage A      Created: Tue Dec 10, 2012  8:22 AM         Appointment is 02/28 @ 10:15                  ----- Message -----         From: Tereso Newcomer, MD         Sent: 12/06/2012   3:56 PM           To: [facility], [facility]            Please schedule patient for colposcopy for ASCUS pap. Please call to inform patient of results and need for appointment.             ------

## 2012-12-20 ENCOUNTER — Encounter: Payer: BC Managed Care – PPO | Admitting: Obstetrics & Gynecology

## 2013-01-24 ENCOUNTER — Encounter: Payer: BC Managed Care – PPO | Admitting: Obstetrics & Gynecology

## 2013-02-05 ENCOUNTER — Encounter (HOSPITAL_COMMUNITY): Payer: Self-pay | Admitting: Emergency Medicine

## 2013-02-05 DIAGNOSIS — I10 Essential (primary) hypertension: Secondary | ICD-10-CM | POA: Insufficient documentation

## 2013-02-05 DIAGNOSIS — R000 Tachycardia, unspecified: Secondary | ICD-10-CM | POA: Insufficient documentation

## 2013-02-05 DIAGNOSIS — E119 Type 2 diabetes mellitus without complications: Secondary | ICD-10-CM | POA: Insufficient documentation

## 2013-02-05 DIAGNOSIS — I82819 Embolism and thrombosis of superficial veins of unspecified lower extremities: Secondary | ICD-10-CM | POA: Insufficient documentation

## 2013-02-05 DIAGNOSIS — Z79899 Other long term (current) drug therapy: Secondary | ICD-10-CM | POA: Insufficient documentation

## 2013-02-05 NOTE — ED Notes (Signed)
PT. REPORTS RIGHT LOWER LEG PAIN FOR SEVERAL MONTHS DENIES INJURY OR FALL / AMBULATORY , STATES HISTORY OF " BLOOD CLOT AT RIGHT LOWER LEG" .

## 2013-02-06 ENCOUNTER — Ambulatory Visit (HOSPITAL_COMMUNITY)
Admission: RE | Admit: 2013-02-06 | Discharge: 2013-02-06 | Disposition: A | Discharge status: Home / Self Care | Payer: BC Managed Care – PPO | Source: Ambulatory Visit | Attending: Emergency Medicine | Admitting: Emergency Medicine

## 2013-02-06 ENCOUNTER — Emergency Department (HOSPITAL_COMMUNITY)
Admission: EM | Admit: 2013-02-06 | Discharge: 2013-02-06 | Disposition: A | Discharge status: Home / Self Care | Payer: BC Managed Care – PPO | Attending: Emergency Medicine | Admitting: Emergency Medicine

## 2013-02-06 ENCOUNTER — Other Ambulatory Visit (HOSPITAL_COMMUNITY): Payer: Self-pay | Admitting: Emergency Medicine

## 2013-02-06 DIAGNOSIS — M79609 Pain in unspecified limb: Secondary | ICD-10-CM

## 2013-02-06 DIAGNOSIS — I82812 Embolism and thrombosis of superficial veins of left lower extremities: Secondary | ICD-10-CM

## 2013-02-06 DIAGNOSIS — M25561 Pain in right knee: Secondary | ICD-10-CM

## 2013-02-06 LAB — PROTIME-INR
INR: 1.02 (ref 0.00–1.49)
Prothrombin Time: 13.3 seconds (ref 11.6–15.2)

## 2013-02-06 LAB — CBC WITH DIFFERENTIAL/PLATELET
Lymphocytes Relative: 36 % (ref 12–46)
Lymphs Abs: 4.2 10*3/uL — ABNORMAL HIGH (ref 0.7–4.0)
MCV: 78.4 fL (ref 78.0–100.0)
Neutro Abs: 6.5 10*3/uL (ref 1.7–7.7)
Neutrophils Relative %: 56 % (ref 43–77)
Platelets: 399 10*3/uL (ref 150–400)
RBC: 4.49 MIL/uL (ref 3.87–5.11)
WBC: 11.6 10*3/uL — ABNORMAL HIGH (ref 4.0–10.5)

## 2013-02-06 LAB — BASIC METABOLIC PANEL
CO2: 24 mEq/L (ref 19–32)
Chloride: 100 mEq/L (ref 96–112)
Glucose, Bld: 297 mg/dL — ABNORMAL HIGH (ref 70–99)
Potassium: 4 mEq/L (ref 3.5–5.1)
Sodium: 135 mEq/L (ref 135–145)

## 2013-02-06 NOTE — ED Provider Notes (Signed)
History     CSN: [id number]  Arrival date & time 02/05/13  2205   First MD Initiated Contact with Patient 02/06/13 0051      Chief Complaint  Patient presents with  . Leg Pain    (Consider location/radiation/quality/duration/timing/severity/associated sxs/prior treatment) HPI 37 year old female presents to emergency room with complaint of swelling and tenderness to the back of her right leg.  Patient has noted.  The not to the back of her leg.  Ongoing for the last several months.  No increase in size, no worse, swelling, no worse.  Pain.  Patient decided tonight.  She needed to get it checked out.  Patient reports past history of blood clot in her leg requiring Lovenox and Coumadin.  Patient points to the front of her leg as the previous knot that she had what she thought was her blood clot.  Patient has remote history of endometrial cancer.  She is currently cancer free.  She is morbidly obese, has diabetes.  She is not a smoker.  No family history of PE or DVT.  She denies any chest pain, no shortness of breath. Past Medical History  Diagnosis Date  . Diabetes mellitus   . Hypertension     Past Surgical History  Procedure Laterality Date  . Wisdom tooth extraction    . Dilation and curettage of uterus      Family History  Problem Relation Age of Onset  . Diabetes Mother   . Hypertension Mother   . Diabetes Father   . Hypertension Father     History  Substance Use Topics  . Smoking status: Never Smoker   . Smokeless tobacco: Never Used  . Alcohol Use: No    OB History   Grav Para Term Preterm Abortions TAB SAB Ect Mult Living   0 0 0 0 0 0 0 0 0 0       Review of Systems  See History of Present Illness; otherwise all other systems are reviewed and negative Allergies  Hydrocodone-acetaminophen and Morphine  Home Medications   Current Outpatient Rx  Name  Route  Sig  Dispense  Refill  . lisinopril-hydrochlorothiazide (PRINZIDE,ZESTORETIC) 10-12.5 MG per  tablet   Oral   Take 1 tablet by mouth daily.           . megestrol (MEGACE) 40 MG tablet   Oral   Take 80 mg by mouth 2 (two) times daily.         . metFORMIN (GLUCOPHAGE) 500 MG tablet   Oral   Take 500 mg by mouth 2 (two) times daily.           Marland Kitchen oxycodone (ROXICODONE) 30 MG immediate release tablet   Oral   Take 30 mg by mouth 2 (two) times daily as needed. For pain           BP 138/85  Pulse 101  Temp(Src) 98.9 F (37.2 C) (Oral)  Resp 24  SpO2 99%  Physical Exam  Nursing note and vitals reviewed. Constitutional: She is oriented to person, place, and time. She appears well-developed and well-nourished. No distress.  Morbidly obese  HENT:  Head: Normocephalic and atraumatic.  Nose: Nose normal.  Mouth/Throat: Oropharynx is clear and moist.  Eyes: Conjunctivae and EOM are normal. Pupils are equal, round, and reactive to light.  Neck: Normal range of motion. Neck supple. No JVD present. No tracheal deviation present. No thyromegaly present.  Cardiovascular: Regular rhythm, normal heart sounds and intact distal pulses.  Exam  reveals no gallop and no friction rub.   No murmur heard. Tachycardia noted.  Patient reports she has a history of fast heart rate around health care providers  Pulmonary/Chest: Effort normal and breath sounds normal. No stridor. No respiratory distress. She has no wheezes. She has no rales. She exhibits no tenderness.  Abdominal: Soft. Bowel sounds are normal. She exhibits no distension and no mass. There is no tenderness. There is no rebound and no guarding.  Musculoskeletal: Normal range of motion. She exhibits tenderness (he shouldn't with quarter size mass to the posterior aspect of her right calf with nickel-sized mass just superior and lateral to this.  She has mild tenderness over the area.  Negative Homans sign.  There is no edema noted to her affected leg, no erythema). She exhibits no edema.  Lymphadenopathy:    She has no cervical  adenopathy.  Neurological: She is alert and oriented to person, place, and time. She exhibits normal muscle tone. Coordination normal.  Skin: Skin is warm and dry. No rash noted. No erythema. No pallor.  Psychiatric: She has a normal mood and affect. Her behavior is normal. Judgment and thought content normal.    ED Course  Procedures (including critical care time)  Labs Reviewed  CBC WITH DIFFERENTIAL - Abnormal; Notable for the following:    WBC 11.6 (*)    HCT 35.2 (*)    Lymphs Abs 4.2 (*)    All other components within normal limits  BASIC METABOLIC PANEL - Abnormal; Notable for the following:    Glucose, Bld 297 (*)    All other components within normal limits  PROTIME-INR   No results found.   1. Superficial thrombosis of leg, left       MDM  37 year old female with chronic nodule, and pain to her right lower leg.  Reading to past notes, patient did have superficial thrombosis to her anterior right leg, as well as a deep vein thrombosis.  Patient is tachycardic here, but she has not dyspneic, no hypoxia, no chest pain.  Patient readily admits that she is anxious.  I do not feel that she has a PE.  I do not feel that her nodule is associated with a DVT.  I have offered the patient to return in the morning for a vascular study for further workup of possible DVT, which she agrees with.        Olivia Mackie, MD 02/06/13 [phone]

## 2013-02-06 NOTE — ED Notes (Signed)
Patient states that she had a blood clot in her right lower leg in the past and was on Coumadin and Lovenox.  Has been off both meds for some time.  Now she has noticed that the spot has moved around to the back of her right thigh.  No redness, swelling or warmth noted.

## 2013-02-06 NOTE — Progress Notes (Signed)
*  PRELIMINARY RESULTS* Vascular Ultrasound Right lower extremity venous duplex has been completed.  Preliminary findings: Technically difficult study due to body habitus. Could not clearly visualize all veins. No evidence of DVT in visualized veins.  Farrel Demark, RDMS, RVT  02/06/2013, 9:58 AM

## 2013-02-18 ENCOUNTER — Ambulatory Visit: Payer: BC Managed Care – PPO | Admitting: Gynecologic Oncology

## 2013-02-22 ENCOUNTER — Other Ambulatory Visit: Payer: Self-pay | Admitting: Obstetrics & Gynecology

## 2013-03-06 ENCOUNTER — Ambulatory Visit: Payer: BC Managed Care – PPO | Admitting: Gynecologic Oncology

## 2013-03-25 ENCOUNTER — Ambulatory Visit: Payer: BC Managed Care – PPO | Admitting: Gynecologic Oncology

## 2013-03-31 ENCOUNTER — Telehealth: Payer: Self-pay | Admitting: *Deleted

## 2013-03-31 ENCOUNTER — Other Ambulatory Visit: Payer: Self-pay | Admitting: Obstetrics & Gynecology

## 2013-03-31 DIAGNOSIS — C541 Malignant neoplasm of endometrium: Secondary | ICD-10-CM

## 2013-03-31 MED ORDER — MEGESTROL ACETATE 40 MG PO TABS: ORAL_TABLET | ORAL | Status: DC

## 2013-03-31 NOTE — Telephone Encounter (Signed)
Pt has an upcoming appt in July. She takes Megace 80 mg twice daily. She is requesting a refill on this to hold her over until her upcoming appt.

## 2013-03-31 NOTE — Telephone Encounter (Signed)
Please call patient to pick up her refilled Megace and remind her of importance of following up for her scheduled appointment in July.

## 2013-04-15 ENCOUNTER — Ambulatory Visit: Payer: BC Managed Care – PPO | Admitting: Gynecologic Oncology

## 2013-04-23 ENCOUNTER — Encounter: Payer: Self-pay | Admitting: Obstetrics & Gynecology

## 2013-04-23 ENCOUNTER — Ambulatory Visit (INDEPENDENT_AMBULATORY_CARE_PROVIDER_SITE_OTHER): Payer: BC Managed Care – PPO | Admitting: Obstetrics & Gynecology

## 2013-04-23 ENCOUNTER — Other Ambulatory Visit (HOSPITAL_COMMUNITY)
Admission: RE | Admit: 2013-04-23 | Discharge: 2013-04-23 | Disposition: A | Discharge status: Home / Self Care | Payer: BC Managed Care – PPO | Source: Ambulatory Visit | Attending: Obstetrics & Gynecology | Admitting: Obstetrics & Gynecology

## 2013-04-23 VITALS — BP 136/79 | HR 104 | Resp 20 | Ht 63.5 in | Wt 379.4 lb

## 2013-04-23 DIAGNOSIS — B372 Candidiasis of skin and nail: Secondary | ICD-10-CM

## 2013-04-23 DIAGNOSIS — N870 Mild cervical dysplasia: Secondary | ICD-10-CM | POA: Insufficient documentation

## 2013-04-23 DIAGNOSIS — IMO0002 Reserved for concepts with insufficient information to code with codable children: Secondary | ICD-10-CM

## 2013-04-23 DIAGNOSIS — R87610 Atypical squamous cells of undetermined significance on cytologic smear of cervix (ASC-US): Secondary | ICD-10-CM

## 2013-04-23 DIAGNOSIS — Z01812 Encounter for preprocedural laboratory examination: Secondary | ICD-10-CM

## 2013-04-23 LAB — POCT PREGNANCY, URINE: Preg Test, Ur: NEGATIVE

## 2013-04-23 MED ORDER — FLUCONAZOLE 150 MG PO TABS: 150.0000 mg | ORAL_TABLET | Freq: Once | ORAL | Status: AC

## 2013-04-23 MED ORDER — NYSTATIN 100000 UNIT/GM EX CREA: TOPICAL_CREAM | Freq: Two times a day (BID) | CUTANEOUS | Status: AC

## 2013-04-23 NOTE — Patient Instructions (Signed)
Return to clinic for any scheduled appointments or for any gynecologic concerns as needed.   

## 2013-04-24 NOTE — Progress Notes (Signed)
GYNECOLOGY CLINIC PROCEDURE NOTE  37 y.o. G0 with known inoperable endometrial cancer treated with high dose progesterone here for colposcopy for ASCUS +HRHPV pap smear on 11/28/2012. Discussed role for HPV in cervical dysplasia, need for surveillance.  Patient given informed consent, signed copy in the chart, time out was performed.  Placed in lithotomy position. Cervix viewed with speculum and colposcope after application of acetic acid.   Colposcopy adequate? Yes AWE area at 6 o'clock about 1 cm from external os, outside TZ; biopsy obtained.  ECC specimen obtained. All specimens were labelled and sent to pathology.  Patient was given post procedure instructions.  Will follow up pathology and manage accordingly.  Routine preventative health maintenance measures emphasized.

## 2013-04-28 ENCOUNTER — Encounter: Payer: Self-pay | Admitting: *Deleted

## 2013-04-30 ENCOUNTER — Encounter: Payer: Self-pay | Admitting: Obstetrics & Gynecology

## 2013-05-01 ENCOUNTER — Telehealth: Payer: Self-pay | Admitting: Obstetrics and Gynecology

## 2013-05-01 NOTE — Telephone Encounter (Addendum)
Message copied by Toula Moos on Thu May 01, 2013  1:26 PM  ------ Called and advised patient of colpo result and appointment for endo biopsy as stated below from Dr. Macon Large. Patient states understanding and satisfied.        Message from: Jaynie Collins A      Created: Wed Apr 30, 2013  3:44 PM       Colposcopy showed CIN I.  Will repeat pap and HPV test in one year.  Please call to inform patient of results and recommendations. Remind her that she needs her every six month endometrial biopsy in August, she can get it during her GYN ONC appointment or make an appointment to do it here at [facility]        ------

## 2013-05-08 ENCOUNTER — Ambulatory Visit: Payer: BC Managed Care – PPO | Admitting: Gynecologic Oncology

## 2013-05-21 ENCOUNTER — Encounter: Payer: Self-pay | Admitting: Obstetrics & Gynecology

## 2013-05-21 ENCOUNTER — Other Ambulatory Visit (HOSPITAL_COMMUNITY)
Admission: RE | Admit: 2013-05-21 | Discharge: 2013-05-21 | Disposition: A | Discharge status: Home / Self Care | Payer: BC Managed Care – PPO | Source: Ambulatory Visit | Attending: Obstetrics & Gynecology | Admitting: Obstetrics & Gynecology

## 2013-05-21 ENCOUNTER — Ambulatory Visit (INDEPENDENT_AMBULATORY_CARE_PROVIDER_SITE_OTHER): Payer: BC Managed Care – PPO | Admitting: Obstetrics & Gynecology

## 2013-05-21 VITALS — BP 175/90 | HR 110 | Temp 98.0°F | Ht 66.0 in | Wt 377.8 lb

## 2013-05-21 DIAGNOSIS — C549 Malignant neoplasm of corpus uteri, unspecified: Secondary | ICD-10-CM | POA: Insufficient documentation

## 2013-05-21 DIAGNOSIS — Z79899 Other long term (current) drug therapy: Secondary | ICD-10-CM | POA: Insufficient documentation

## 2013-05-21 DIAGNOSIS — C541 Malignant neoplasm of endometrium: Secondary | ICD-10-CM

## 2013-05-21 LAB — POCT PREGNANCY, URINE: Preg Test, Ur: NEGATIVE

## 2013-05-21 NOTE — Progress Notes (Signed)
GYNECOLOGY CLINIC PROCEDURE NOTE  37 y.o. G0 with known inoperable endometrial cancer treated with high dose progesterone here for every six month surveillance endometrial biopsy.   ENDOMETRIAL BIOPSY     The indications for endometrial biopsy were reviewed.   Risks of the biopsy including cramping, bleeding, infection, uterine perforation, inadequate specimen and need for additional procedures  were discussed. The patient states she understands and agrees to undergo procedure today. Consent was signed. Time out was performed.  A speculum was placed in the patient's vagina and the cervix was prepped with Betadine. A single-toothed tenaculum was placed on the anterior lip of the cervix to stabilize it. The 3 mm pipelle was introduced into the endometrial cavity without difficulty to a depth of 10 cm, and a moderate amount of tissue was obtained and sent to pathology. The instruments were removed from the patient's vagina. Minimal bleeding from the cervix was noted. The patient tolerated the procedure well. Routine post-procedure instructions were given to the patient.    Will follow up results.  Patient to follow up with Dr. Nelly Rout (GYN ONC) next month for further ongoing care.  Jaynie Collins, MD, FACOG Attending Obstetrician & Gynecologist Faculty Practice, [facility]

## 2013-05-21 NOTE — Patient Instructions (Signed)
Return to clinic for any scheduled appointments or for any gynecologic concerns as needed.   

## 2013-05-21 NOTE — Progress Notes (Signed)
Pt will start new Rx for BP on Friday

## 2013-05-26 ENCOUNTER — Telehealth: Payer: Self-pay | Admitting: Obstetrics and Gynecology

## 2013-05-26 NOTE — Telephone Encounter (Addendum)
Message copied by Toula Moos on Mon May 26, 2013  1:59 PM   ------ Called patient to give result of endo bx (noted below). No answer, left message to RTC.       Message from: Tereso Newcomer      Created: Sun May 25, 2013 12:21 PM       Benign endometrial biopsy. Please call to inform patient of results. ------ Called pt and informed her of normal endometrial biopsy results. She voiced understanding.

## 2013-06-17 ENCOUNTER — Encounter: Payer: Self-pay | Admitting: Gynecologic Oncology

## 2013-06-17 ENCOUNTER — Ambulatory Visit: Payer: BC Managed Care – PPO | Attending: Gynecologic Oncology | Admitting: Gynecologic Oncology

## 2013-06-17 VITALS — Ht 63.0 in | Wt 371.3 lb

## 2013-06-17 DIAGNOSIS — C541 Malignant neoplasm of endometrium: Secondary | ICD-10-CM

## 2013-06-17 DIAGNOSIS — C549 Malignant neoplasm of corpus uteri, unspecified: Secondary | ICD-10-CM | POA: Insufficient documentation

## 2013-06-17 DIAGNOSIS — Z6841 Body Mass Index (BMI) 40.0 and over, adult: Secondary | ICD-10-CM | POA: Insufficient documentation

## 2013-06-17 DIAGNOSIS — R609 Edema, unspecified: Secondary | ICD-10-CM | POA: Insufficient documentation

## 2013-06-17 DIAGNOSIS — I8000 Phlebitis and thrombophlebitis of superficial vessels of unspecified lower extremity: Secondary | ICD-10-CM | POA: Insufficient documentation

## 2013-06-17 DIAGNOSIS — Z86718 Personal history of other venous thrombosis and embolism: Secondary | ICD-10-CM | POA: Insufficient documentation

## 2013-06-17 DIAGNOSIS — Z79899 Other long term (current) drug therapy: Secondary | ICD-10-CM | POA: Insufficient documentation

## 2013-06-17 NOTE — Patient Instructions (Signed)
Follow up in one year or sooner if needed and Dr. Macon Large in 6 months.  Continue Megace and talk with Dr. Macon Large about birth control options.  Please call [phone] in June or July 2015 to schedule an appointment in August 2015 with Dr. Nelly Rout.

## 2013-06-17 NOTE — Progress Notes (Signed)
Office Visit:  GYN ONCOLOGY  REASON FOR VISIT:  Endometrial cancer.  HISTORY OF PRESENT ILLNESS:  This is a 37 y.o. nulliparous female,last normal menstrual period, April 21, 2010.  Prior normal menstrual period early June 2011.  She reports a lifelong irregular menses,however, she has had severe menorrhagia since April 2011.  A course of oral contraceptive pills was applied after the incident of menorrhagia, which was associated with transfusion of 5 units of packed red blood cells.  She was subsequently diagnosed with both a superficial and right lower extremity thrombosis.   2 Minrena IUDs have been inserted a robotic spelled. The second expulsion occurred one year ago. She is currently on Megace 40 mg twice daily.  An endometrial biopsy collected on 05/21/2013 demonstrated benign polypoid endometrium with progesterone juice stromal change and was negative for hyperplasia or malignancy .  Chelsey Clark is on a weight loss challenge program and also uses metformin. She reports a 40 pound weight loss since our last interaction.   PAST MEDICAL HISTORY: 1. Morbid obesity since childhood to be more than 100 pounds at age of     53. 2. Right lower extremity superficial thrombophlebitis. 3. right lower extremity deep venous thrombosis in May 2011.  PAST SURGICAL HISTORY:  D and C in June 2011 reporting a grade 1endometrial adenocarcinoma.  FAMILY HISTORY:  Maternal uncle, lung cancer, paternal uncle, lung cancer, both of whom were smokers.  A paternal grandfather, bone cancer diagnosed in his 55s.  SOCIAL HISTORY:  Tobacco use, denies.  Alcohol use, denies.  Unemployed at present, but previously a case Production designer, theatre/television/film in mental health.  ALLERGIES:  VICODIN and MORPHINE that cause hives.  REVIEW OF SYSTEMS Constitutional  Feels well  Cardiovascular  No chest pain, shortness of breath, or edema  Pulmonary  No cough or wheeze.  Gastro Intestinal  No nausea, vomitting, or diarrhoea. No bright red blood  per rectum, no abdominal pain, change in bowel movement, or constipation.  Genito Urinary  No frequency, urgency, dysuria,  No incontinence denies menses, no vaginal discharge Musculo Skeletal  No myalgia, arthralgia, joint swelling or pain  Neurologic  No weakness, numbness, change in gait,  Psychology  No depression, anxiety, insomnia.   PHYSICAL EXAMINATION:  GENERAL:  A well-developed, very pleasant female in no acute distress. VITAL SIGNS: Ht 5\' 3"  (1.6 m)  Wt 371 lb 4.8 oz (168.421 kg)  BMI 65.79 kg/m2 Wt Readings from Last 3 Encounters:  06/17/13 371 lb 4.8 oz (168.421 kg)  05/21/13 377 lb 12.8 oz (171.369 kg)  04/23/13 379 lb 6.4 oz (172.095 kg)  CHEST:  Clear to auscultation. BACK:  No CVA tenderness. ABDOMEN:  Soft, obese.  No palpable masses.  No ascites. EXTREMITIES:  1 to 2+ lower extremity edema bilaterally.   IMPRESSION:  Endometrial adenocarcinoma.  This is a  37 y.o. with  grade 1 endometrioid adenocarcinoma based on the D and C of April 07, 2010.  Chelsey Clark weight precludes hysterectomy or staging as a surgical option. Two Mirena  IUDs were inserted and were associated with expulsion last one greater than one year ago. Since that time she has been taking Megace 40 mg twice daily. An endometrial biopsy on 05/21/2013 and was notable for absence of hyperplasia or malignancy.  Chelsey Clark has lost over 40 pounds since our recent encounter encourage her to continue weight loss program and Megace. She will followup with Dr.Anyanwu in 6 months and GYN oncology in one year.  The patient had questions  about contraceptive options and I suggested this decision making be done instead with Dr.Anyanwu

## 2013-08-20 ENCOUNTER — Other Ambulatory Visit: Payer: Self-pay | Admitting: Obstetrics & Gynecology

## 2013-08-20 ENCOUNTER — Encounter: Payer: Self-pay | Admitting: *Deleted

## 2013-09-27 ENCOUNTER — Other Ambulatory Visit: Payer: Self-pay | Admitting: Obstetrics & Gynecology

## 2013-11-01 ENCOUNTER — Other Ambulatory Visit: Payer: Self-pay | Admitting: Obstetrics & Gynecology

## 2013-12-05 ENCOUNTER — Other Ambulatory Visit: Payer: Self-pay | Admitting: Obstetrics & Gynecology

## 2013-12-05 DIAGNOSIS — C541 Malignant neoplasm of endometrium: Secondary | ICD-10-CM

## 2014-08-11 ENCOUNTER — Telehealth: Payer: Self-pay

## 2014-08-11 DIAGNOSIS — C541 Malignant neoplasm of endometrium: Secondary | ICD-10-CM

## 2014-08-11 MED ORDER — MEGESTROL ACETATE 40 MG PO TABS: ORAL_TABLET | ORAL | Status: DC

## 2014-08-11 NOTE — Telephone Encounter (Signed)
Patient walked into clinic today requesting refill of Megace- states she has been out for a week or 2 and is in desparate need of refill. F/u appointment scheduled for 09/30/14 with Dr. Harolyn Rutherford. Dr. Roselie Awkward in clinic today and approves refill. Medication refilled and e-prescribed to [facility] in [place]. Patient informed. No further questions or concerns.

## 2014-09-30 ENCOUNTER — Other Ambulatory Visit: Payer: BC Managed Care – PPO | Admitting: Obstetrics & Gynecology

## 2014-11-09 ENCOUNTER — Other Ambulatory Visit: Payer: BC Managed Care – PPO | Admitting: Obstetrics & Gynecology

## 2014-12-11 ENCOUNTER — Other Ambulatory Visit (HOSPITAL_COMMUNITY)
Admission: RE | Admit: 2014-12-11 | Discharge: 2014-12-11 | Disposition: A | Discharge status: Home / Self Care | Payer: BC Managed Care – PPO | Source: Ambulatory Visit | Attending: Obstetrics & Gynecology | Admitting: Obstetrics & Gynecology

## 2014-12-11 ENCOUNTER — Encounter: Payer: Self-pay | Admitting: Obstetrics & Gynecology

## 2014-12-11 ENCOUNTER — Ambulatory Visit (INDEPENDENT_AMBULATORY_CARE_PROVIDER_SITE_OTHER): Payer: BC Managed Care – PPO | Admitting: Obstetrics & Gynecology

## 2014-12-11 VITALS — BP 128/77 | HR 100 | Ht 63.5 in | Wt 331.5 lb

## 2014-12-11 DIAGNOSIS — C541 Malignant neoplasm of endometrium: Secondary | ICD-10-CM | POA: Insufficient documentation

## 2014-12-11 DIAGNOSIS — Z01419 Encounter for gynecological examination (general) (routine) without abnormal findings: Secondary | ICD-10-CM

## 2014-12-11 DIAGNOSIS — Z1151 Encounter for screening for human papillomavirus (HPV): Secondary | ICD-10-CM

## 2014-12-11 DIAGNOSIS — IMO0002 Reserved for concepts with insufficient information to code with codable children: Secondary | ICD-10-CM

## 2014-12-11 DIAGNOSIS — R896 Abnormal cytological findings in specimens from other organs, systems and tissues: Secondary | ICD-10-CM

## 2014-12-11 DIAGNOSIS — Z124 Encounter for screening for malignant neoplasm of cervix: Secondary | ICD-10-CM

## 2014-12-11 LAB — POCT PREGNANCY, URINE: Preg Test, Ur: NEGATIVE

## 2014-12-11 NOTE — Progress Notes (Signed)
GYNECOLOGY CLINIC ANNUAL PREVENTATIVE CARE ENCOUNTER NOTE  Subjective:     Chelsey Clark is a 39 y.o. G24 female with known inoperable endometrial cancer treated with high dose progesterone here for every surveillance endometrial biopsy and annual exam/pap smear.  Patient was last seen here in February 2014 and has not followed up since then; she had ASCUS and HRHPV pap smear with benign colposcopy,  and benign endometrial biopsy on 04/23/13. She has been taking Megace 80 mg daily, denies any symptoms. She last saw Dr. Skeet Latch on 06/07/13; the recommendation was to get an endometrial biopsy every six months for surveillance of her endometrial cancer. She is not sexually active, has not been since 2009.  Has been on Phentermine and Topamax and lost significant weight. Denies any GYN symptoms.   Gynecologic History No LMP recorded. Patient is not currently having periods (Reason: Other). Contraception: none Last Pap: 11/29/12. Results were: abnormal ASCUS +HRHPV with benign colposcopy   History   Social History  . Marital Status: Single    Spouse Name: N/A  . Number of Children: N/A  . Years of Education: N/A   Occupational History  . Not on file.   Social History Main Topics  . Smoking status: Never Smoker   . Smokeless tobacco: Never Used  . Alcohol Use: No  . Drug Use: No  . Sexual Activity: No   Other Topics Concern  . Not on file   Social History Narrative    The following portions of the patient's history were reviewed and updated as appropriate: allergies, current medications, past family history, past medical history, past social history, past surgical history and problem list.  Review of Systems A comprehensive review of systems was negative.   Objective:   BP 128/77 mmHg  Pulse 100  Ht 5' 3.5" (1.613 m)  Wt 331 lb 8 oz (150.367 kg)  BMI 57.79 kg/m2   Wt Readings from Last 3 Encounters:  12/11/14 331 lb 8 oz (150.367 kg)  06/17/13 371 lb 4.8 oz (168.421  kg)  05/21/13 377 lb 12.8 oz (171.369 kg)   GENERAL: Well-developed, morbidly obese, well-nourished female in no acute distress.  HEENT: Normocephalic, atraumatic. Sclerae anicteric.  NECK: Supple. Normal thyroid.  LUNGS: Clear to auscultation bilaterally.  HEART: Regular rate and rhythm. BREASTS: Symmetric, pendulous breasts. No masses, skin changes, nipple drainage, or lymphadenopathy. ABDOMEN: Soft, obese, nontender, nondistended. No organomegaly. PELVIC: Normal external female genitalia. Vagina is pink and rugated. Normal discharge. Normal cervix contour. Pap smear obtained. Uterus and adnexa unable to be palpated secondary to habitus EXTREMITIES: No cyanosis, clubbing, or edema, 2+ distal pulses.  ENDOMETRIAL BIOPSY   The indications for endometrial biopsy were reviewed. Risks of the biopsy including cramping, bleeding, infection, uterine perforation, inadequate specimen and need for additional procedures were discussed. The patient states she understands and agrees to undergo procedure today. Consent was signed. Time out was performed. Urine HCG was negative. After the pap smear was done, the cervix was prepped with Betadine. The 3 mm pipelle was introduced into the endometrial cavity without difficulty to a depth of 10 cm, and a moderate amount of tissue was obtained and sent to pathology. The instruments were removed from the patient's vagina. Minimal bleeding from the cervix was noted. The patient tolerated the procedure well.   Assessment:   Annual female exam Endometrial cancer surveillance via endometrial biopsy   Plan:   Pap done, will follow up results and manage accordingly. The patient will be called  with the results of the endometrial biopsy and will be given recommendations for further management.  Stressed importance of endometrial biopsy every six months for surveillance; and congratulated on continued efforts to lose weight (lost 54 pounds compared to last  visit). Patient will call GYN ONC to make appointment Routine preventative health maintenance measures emphasized   Verita Schneiders, MD, [place] Attending [place] for [place], [place]

## 2014-12-11 NOTE — Patient Instructions (Addendum)
Call [phone] for [place] appointment  Preventive Care for Adults A healthy lifestyle and preventive care can promote health and wellness. Preventive health guidelines for women include the following key practices.  A routine yearly physical is a good way to check with your health care provider about your health and preventive screening. It is a chance to share any concerns and updates on your health and to receive a thorough exam.  Visit your dentist for a routine exam and preventive care every 6 months. Brush your teeth twice a day and floss once a day. Good oral hygiene prevents tooth decay and gum disease.  The frequency of eye exams is based on your age, health, family medical history, use of contact lenses, and other factors. Follow your health care provider's recommendations for frequency of eye exams.  Eat a healthy diet. Foods like vegetables, fruits, whole grains, low-fat dairy products, and lean protein foods contain the nutrients you need without too many calories. Decrease your intake of foods high in solid fats, added sugars, and salt. Eat the right amount of calories for you.Get information about a proper diet from your health care provider, if necessary.  Regular physical exercise is one of the most important things you can do for your health. Most adults should get at least 150 minutes of moderate-intensity exercise (any activity that increases your heart rate and causes you to sweat) each week. In addition, most adults need muscle-strengthening exercises on 2 or more days a week.  Maintain a healthy weight. The body mass index (BMI) is a screening tool to identify possible weight problems. It provides an estimate of body fat based on height and weight. Your health care provider can find your BMI and can help you achieve or maintain a healthy weight.For adults 20 years and older:  A BMI below 18.5 is considered underweight.  A BMI of 18.5 to 24.9 is normal.  A BMI of 25 to  29.9 is considered overweight.  A BMI of 30 and above is considered obese.  Maintain normal blood lipids and cholesterol levels by exercising and minimizing your intake of saturated fat. Eat a balanced diet with plenty of fruit and vegetables. Blood tests for lipids and cholesterol should begin at age 29 and be repeated every 5 years. If your lipid or cholesterol levels are high, you are over 50, or you are at high risk for heart disease, you may need your cholesterol levels checked more frequently.Ongoing high lipid and cholesterol levels should be treated with medicines if diet and exercise are not working.  If you smoke, find out from your health care provider how to quit. If you do not use tobacco, do not start.  Lung cancer screening is recommended for adults aged 23-80 years who are at high risk for developing lung cancer because of a history of smoking. A yearly low-dose CT scan of the lungs is recommended for people who have at least a 30-pack-year history of smoking and are a current smoker or have quit within the past 15 years. A pack year of smoking is smoking an average of 1 pack of cigarettes a day for 1 year (for example: 1 pack a day for 30 years or 2 packs a day for 15 years). Yearly screening should continue until the smoker has stopped smoking for at least 15 years. Yearly screening should be stopped for people who develop a health problem that would prevent them from having lung cancer treatment.  If you are pregnant, do  not drink alcohol. If you are breastfeeding, be very cautious about drinking alcohol. If you are not pregnant and choose to drink alcohol, do not have more than 1 drink per day. One drink is considered to be 12 ounces (355 mL) of beer, 5 ounces (148 mL) of wine, or 1.5 ounces (44 mL) of liquor.  Avoid use of street drugs. Do not share needles with anyone. Ask for help if you need support or instructions about stopping the use of drugs.  High blood pressure causes  heart disease and increases the risk of stroke. Your blood pressure should be checked at least every 1 to 2 years. Ongoing high blood pressure should be treated with medicines if weight loss and exercise do not work.  If you are 35-74 years old, ask your health care provider if you should take aspirin to prevent strokes.  Diabetes screening involves taking a blood sample to check your fasting blood sugar level. This should be done once every 3 years, after age 65, if you are within normal weight and without risk factors for diabetes. Testing should be considered at a younger age or be carried out more frequently if you are overweight and have at least 1 risk factor for diabetes.  Breast cancer screening is essential preventive care for women. You should practice "breast self-awareness." This means understanding the normal appearance and feel of your breasts and may include breast self-examination. Any changes detected, no matter how small, should be reported to a health care provider. Women in their 65s and 30s should have a clinical breast exam (CBE) by a health care provider as part of a regular health exam every 1 to 3 years. After age 83, women should have a CBE every year. Starting at age 75, women should consider having a mammogram (breast X-ray test) every year. Women who have a family history of breast cancer should talk to their health care provider about genetic screening. Women at a high risk of breast cancer should talk to their health care providers about having an MRI and a mammogram every year.  Breast cancer gene (BRCA)-related cancer risk assessment is recommended for women who have family members with BRCA-related cancers. BRCA-related cancers include breast, ovarian, tubal, and peritoneal cancers. Having family members with these cancers may be associated with an increased risk for harmful changes (mutations) in the breast cancer genes BRCA1 and BRCA2. Results of the assessment will  determine the need for genetic counseling and BRCA1 and BRCA2 testing.  Routine pelvic exams to screen for cancer are no longer recommended for nonpregnant women who are considered low risk for cancer of the pelvic organs (ovaries, uterus, and vagina) and who do not have symptoms. Ask your health care provider if a screening pelvic exam is right for you.  If you have had past treatment for cervical cancer or a condition that could lead to cancer, you need Pap tests and screening for cancer for at least 20 years after your treatment. If Pap tests have been discontinued, your risk factors (such as having a new sexual partner) need to be reassessed to determine if screening should be resumed. Some women have medical problems that increase the chance of getting cervical cancer. In these cases, your health care provider may recommend more frequent screening and Pap tests.  The HPV test is an additional test that may be used for cervical cancer screening. The HPV test looks for the virus that can cause the cell changes on the cervix. The cells  collected during the Pap test can be tested for HPV. The HPV test could be used to screen women aged 32 years and older, and should be used in women of any age who have unclear Pap test results. After the age of 57, women should have HPV testing at the same frequency as a Pap test.  Colorectal cancer can be detected and often prevented. Most routine colorectal cancer screening begins at the age of 64 years and continues through age 65 years. However, your health care provider may recommend screening at an earlier age if you have risk factors for colon cancer. On a yearly basis, your health care provider may provide home test kits to check for hidden blood in the stool. Use of a small camera at the end of a tube, to directly examine the colon (sigmoidoscopy or colonoscopy), can detect the earliest forms of colorectal cancer. Talk to your health care provider about this at age  24, when routine screening begins. Direct exam of the colon should be repeated every 5-10 years through age 59 years, unless early forms of pre-cancerous polyps or small growths are found.  People who are at an increased risk for hepatitis B should be screened for this virus. You are considered at high risk for hepatitis B if:  You were born in a country where hepatitis B occurs often. Talk with your health care provider about which countries are considered high risk.  Your parents were born in a high-risk country and you have not received a shot to protect against hepatitis B (hepatitis B vaccine).  You have HIV or AIDS.  You use needles to inject street drugs.  You live with, or have sex with, someone who has hepatitis B.  You get hemodialysis treatment.  You take certain medicines for conditions like cancer, organ transplantation, and autoimmune conditions.  Hepatitis C blood testing is recommended for all people born from [birth year] through 1965 and any individual with known risks for hepatitis C.  Practice safe sex. Use condoms and avoid high-risk sexual practices to reduce the spread of sexually transmitted infections (STIs). STIs include gonorrhea, chlamydia, syphilis, trichomonas, herpes, HPV, and human immunodeficiency virus (HIV). Herpes, HIV, and HPV are viral illnesses that have no cure. They can result in disability, cancer, and death.  You should be screened for sexually transmitted illnesses (STIs) including gonorrhea and chlamydia if:  You are sexually active and are younger than 24 years.  You are older than 24 years and your health care provider tells you that you are at risk for this type of infection.  Your sexual activity has changed since you were last screened and you are at an increased risk for chlamydia or gonorrhea. Ask your health care provider if you are at risk.  If you are at risk of being infected with HIV, it is recommended that you take a prescription  medicine daily to prevent HIV infection. This is called preexposure prophylaxis (PrEP). You are considered at risk if:  You are a heterosexual woman, are sexually active, and are at increased risk for HIV infection.  You take drugs by injection.  You are sexually active with a partner who has HIV.  Talk with your health care provider about whether you are at high risk of being infected with HIV. If you choose to begin PrEP, you should first be tested for HIV. You should then be tested every 3 months for as long as you are taking PrEP.  Osteoporosis is a disease in  which the bones lose minerals and strength with aging. This can result in serious bone fractures or breaks. The risk of osteoporosis can be identified using a bone density scan. Women ages 105 years and over and women at risk for fractures or osteoporosis should discuss screening with their health care providers. Ask your health care provider whether you should take a calcium supplement or vitamin D to reduce the rate of osteoporosis.  Menopause can be associated with physical symptoms and risks. Hormone replacement therapy is available to decrease symptoms and risks. You should talk to your health care provider about whether hormone replacement therapy is right for you.  Use sunscreen. Apply sunscreen liberally and repeatedly throughout the day. You should seek shade when your shadow is shorter than you. Protect yourself by wearing long sleeves, pants, a wide-brimmed hat, and sunglasses year round, whenever you are outdoors.  Once a month, do a whole body skin exam, using a mirror to look at the skin on your back. Tell your health care provider of new moles, moles that have irregular borders, moles that are larger than a pencil eraser, or moles that have changed in shape or color.  Stay current with required vaccines (immunizations).  Influenza vaccine. All adults should be immunized every year.  Tetanus, diphtheria, and acellular  pertussis (Td, Tdap) vaccine. Pregnant women should receive 1 dose of Tdap vaccine during each pregnancy. The dose should be obtained regardless of the length of time since the last dose. Immunization is preferred during the 27th-36th week of gestation. An adult who has not previously received Tdap or who does not know her vaccine status should receive 1 dose of Tdap. This initial dose should be followed by tetanus and diphtheria toxoids (Td) booster doses every 10 years. Adults with an unknown or incomplete history of completing a 3-dose immunization series with Td-containing vaccines should begin or complete a primary immunization series including a Tdap dose. Adults should receive a Td booster every 10 years.  Varicella vaccine. An adult without evidence of immunity to varicella should receive 2 doses or a second dose if she has previously received 1 dose. Pregnant females who do not have evidence of immunity should receive the first dose after pregnancy. This first dose should be obtained before leaving the health care facility. The second dose should be obtained 4-8 weeks after the first dose.  Human papillomavirus (HPV) vaccine. Females aged 13-26 years who have not received the vaccine previously should obtain the 3-dose series. The vaccine is not recommended for use in pregnant females. However, pregnancy testing is not needed before receiving a dose. If a female is found to be pregnant after receiving a dose, no treatment is needed. In that case, the remaining doses should be delayed until after the pregnancy. Immunization is recommended for any person with an immunocompromised condition through the age of 26 years if she did not get any or all doses earlier. During the 3-dose series, the second dose should be obtained 4-8 weeks after the first dose. The third dose should be obtained 24 weeks after the first dose and 16 weeks after the second dose.  Zoster vaccine. One dose is recommended for adults  aged 66 years or older unless certain conditions are present.  Measles, mumps, and rubella (MMR) vaccine. Adults born before 45 generally are considered immune to measles and mumps. Adults born in [birth year] or later should have 1 or more doses of MMR vaccine unless there is a contraindication to the vaccine or  there is laboratory evidence of immunity to each of the three diseases. A routine second dose of MMR vaccine should be obtained at least 28 days after the first dose for students attending postsecondary schools, health care workers, or international travelers. People who received inactivated measles vaccine or an unknown type of measles vaccine during 1963-1967 should receive 2 doses of MMR vaccine. People who received inactivated mumps vaccine or an unknown type of mumps vaccine before 1979 and are at high risk for mumps infection should consider immunization with 2 doses of MMR vaccine. For females of childbearing age, rubella immunity should be determined. If there is no evidence of immunity, females who are not pregnant should be vaccinated. If there is no evidence of immunity, females who are pregnant should delay immunization until after pregnancy. Unvaccinated health care workers born before [birth year] who lack laboratory evidence of measles, mumps, or rubella immunity or laboratory confirmation of disease should consider measles and mumps immunization with 2 doses of MMR vaccine or rubella immunization with 1 dose of MMR vaccine.  Pneumococcal 13-valent conjugate (PCV13) vaccine. When indicated, a person who is uncertain of her immunization history and has no record of immunization should receive the PCV13 vaccine. An adult aged 5 years or older who has certain medical conditions and has not been previously immunized should receive 1 dose of PCV13 vaccine. This PCV13 should be followed with a dose of pneumococcal polysaccharide (PPSV23) vaccine. The PPSV23 vaccine dose should be obtained at least 8 weeks  after the dose of PCV13 vaccine. An adult aged 68 years or older who has certain medical conditions and previously received 1 or more doses of PPSV23 vaccine should receive 1 dose of PCV13. The PCV13 vaccine dose should be obtained 1 or more years after the last PPSV23 vaccine dose.  Pneumococcal polysaccharide (PPSV23) vaccine. When PCV13 is also indicated, PCV13 should be obtained first. All adults aged 58 years and older should be immunized. An adult younger than age 25 years who has certain medical conditions should be immunized. Any person who resides in a nursing home or long-term care facility should be immunized. An adult smoker should be immunized. People with an immunocompromised condition and certain other conditions should receive both PCV13 and PPSV23 vaccines. People with human immunodeficiency virus (HIV) infection should be immunized as soon as possible after diagnosis. Immunization during chemotherapy or radiation therapy should be avoided. Routine use of PPSV23 vaccine is not recommended for American Indians, [place] Natives, or people younger than 65 years unless there are medical conditions that require PPSV23 vaccine. When indicated, people who have unknown immunization and have no record of immunization should receive PPSV23 vaccine. One-time revaccination 5 years after the first dose of PPSV23 is recommended for people aged 19-64 years who have chronic kidney failure, nephrotic syndrome, asplenia, or immunocompromised conditions. People who received 1-2 doses of PPSV23 before age 64 years should receive another dose of PPSV23 vaccine at age 39 years or later if at least 5 years have passed since the previous dose. Doses of PPSV23 are not needed for people immunized with PPSV23 at or after age 13 years.  Meningococcal vaccine. Adults with asplenia or persistent complement component deficiencies should receive 2 doses of quadrivalent meningococcal conjugate (MenACWY-D) vaccine. The doses  should be obtained at least 2 months apart. Microbiologists working with certain meningococcal bacteria, [place] recruits, people at risk during an outbreak, and people who travel to or live in countries with a high rate of meningitis should be immunized.  A first-year college student up through age 71 years who is living in a residence hall should receive a dose if she did not receive a dose on or after her 16th birthday. Adults who have certain high-risk conditions should receive one or more doses of vaccine.  Hepatitis A vaccine. Adults who wish to be protected from this disease, have certain high-risk conditions, work with hepatitis A-infected animals, work in hepatitis A research labs, or travel to or work in countries with a high rate of hepatitis A should be immunized. Adults who were previously unvaccinated and who anticipate close contact with an international adoptee during the first 60 days after arrival in the [place] from a country with a high rate of hepatitis A should be immunized.  Hepatitis B vaccine. Adults who wish to be protected from this disease, have certain high-risk conditions, may be exposed to blood or other infectious body fluids, are household contacts or sex partners of hepatitis B positive people, are clients or workers in certain care facilities, or travel to or work in countries with a high rate of hepatitis B should be immunized.  Haemophilus influenzae type b (Hib) vaccine. A previously unvaccinated person with asplenia or sickle cell disease or having a scheduled splenectomy should receive 1 dose of Hib vaccine. Regardless of previous immunization, a recipient of a hematopoietic stem cell transplant should receive a 3-dose series 6-12 months after her successful transplant. Hib vaccine is not recommended for adults with HIV infection. Preventive Services / Frequency Ages 67 to 57 years  Blood pressure check.** / Every 1 to 2 years.  Lipid and cholesterol check.**  / Every 5 years beginning at age 79.  Clinical breast exam.** / Every 3 years for women in their 25s and 39s.  BRCA-related cancer risk assessment.** / For women who have family members with a BRCA-related cancer (breast, ovarian, tubal, or peritoneal cancers).  Pap test.** / Every 2 years from ages 36 through 44. Every 3 years starting at age 69 through age 25 or 73 with a history of 3 consecutive normal Pap tests.  HPV screening.** / Every 3 years from ages 51 through ages 39 to 38 with a history of 3 consecutive normal Pap tests.  Hepatitis C blood test.** / For any individual with known risks for hepatitis C.  Skin self-exam. / Monthly.  Influenza vaccine. / Every year.  Tetanus, diphtheria, and acellular pertussis (Tdap, Td) vaccine.** / Consult your health care provider. Pregnant women should receive 1 dose of Tdap vaccine during each pregnancy. 1 dose of Td every 10 years.  Varicella vaccine.** / Consult your health care provider. Pregnant females who do not have evidence of immunity should receive the first dose after pregnancy.  HPV vaccine. / 3 doses over 6 months, if 54 and younger. The vaccine is not recommended for use in pregnant females. However, pregnancy testing is not needed before receiving a dose.  Measles, mumps, rubella (MMR) vaccine.** / You need at least 1 dose of MMR if you were born in 1957 or later. You may also need a 2nd dose. For females of childbearing age, rubella immunity should be determined. If there is no evidence of immunity, females who are not pregnant should be vaccinated. If there is no evidence of immunity, females who are pregnant should delay immunization until after pregnancy.  Pneumococcal 13-valent conjugate (PCV13) vaccine.** / Consult your health care provider.  Pneumococcal polysaccharide (PPSV23) vaccine.** / 1 to 2 doses if you smoke cigarettes or if  you have certain conditions.  Meningococcal vaccine.** / 1 dose if you are age 44 to 29  years and a Market researcher living in a residence hall, or have one of several medical conditions, you need to get vaccinated against meningococcal disease. You may also need additional booster doses.  Hepatitis A vaccine.** / Consult your health care provider.  Hepatitis B vaccine.** / Consult your health care provider.  Haemophilus influenzae type b (Hib) vaccine.** / Consult your health care provider. Ages 23 to 78 years  Blood pressure check.** / Every 1 to 2 years.  Lipid and cholesterol check.** / Every 5 years beginning at age 72 years.  Lung cancer screening. / Every year if you are aged 28-80 years and have a 30-pack-year history of smoking and currently smoke or have quit within the past 15 years. Yearly screening is stopped once you have quit smoking for at least 15 years or develop a health problem that would prevent you from having lung cancer treatment.  Clinical breast exam.** / Every year after age 34 years.  BRCA-related cancer risk assessment.** / For women who have family members with a BRCA-related cancer (breast, ovarian, tubal, or peritoneal cancers).  Mammogram.** / Every year beginning at age 30 years and continuing for as long as you are in good health. Consult with your health care provider.  Pap test.** / Every 3 years starting at age 20 years through age 74 or 60 years with a history of 3 consecutive normal Pap tests.  HPV screening.** / Every 3 years from ages 12 years through ages 62 to 22 years with a history of 3 consecutive normal Pap tests.  Fecal occult blood test (FOBT) of stool. / Every year beginning at age 65 years and continuing until age 64 years. You may not need to do this test if you get a colonoscopy every 10 years.  Flexible sigmoidoscopy or colonoscopy.** / Every 5 years for a flexible sigmoidoscopy or every 10 years for a colonoscopy beginning at age 29 years and continuing until age 56 years.  Hepatitis C blood test.** / For  all people born from [birth year] through 1964 and any individual with known risks for hepatitis C.  Skin self-exam. / Monthly.  Influenza vaccine. / Every year.  Tetanus, diphtheria, and acellular pertussis (Tdap/Td) vaccine.** / Consult your health care provider. Pregnant women should receive 1 dose of Tdap vaccine during each pregnancy. 1 dose of Td every 10 years.  Varicella vaccine.** / Consult your health care provider. Pregnant females who do not have evidence of immunity should receive the first dose after pregnancy.  Zoster vaccine.** / 1 dose for adults aged 39 years or older.  Measles, mumps, rubella (MMR) vaccine.** / You need at least 1 dose of MMR if you were born in 1957 or later. You may also need a 2nd dose. For females of childbearing age, rubella immunity should be determined. If there is no evidence of immunity, females who are not pregnant should be vaccinated. If there is no evidence of immunity, females who are pregnant should delay immunization until after pregnancy.  Pneumococcal 13-valent conjugate (PCV13) vaccine.** / Consult your health care provider.  Pneumococcal polysaccharide (PPSV23) vaccine.** / 1 to 2 doses if you smoke cigarettes or if you have certain conditions.  Meningococcal vaccine.** / Consult your health care provider.  Hepatitis A vaccine.** / Consult your health care provider.  Hepatitis B vaccine.** / Consult your health care provider.  Haemophilus influenzae type b (Hib) vaccine.** /  Consult your health care provider. Ages 11 years and over  Blood pressure check.** / Every 1 to 2 years.  Lipid and cholesterol check.** / Every 5 years beginning at age 27 years.  Lung cancer screening. / Every year if you are aged 59-80 years and have a 30-pack-year history of smoking and currently smoke or have quit within the past 15 years. Yearly screening is stopped once you have quit smoking for at least 15 years or develop a health problem that would prevent  you from having lung cancer treatment.  Clinical breast exam.** / Every year after age 45 years.  BRCA-related cancer risk assessment.** / For women who have family members with a BRCA-related cancer (breast, ovarian, tubal, or peritoneal cancers).  Mammogram.** / Every year beginning at age 46 years and continuing for as long as you are in good health. Consult with your health care provider.  Pap test.** / Every 3 years starting at age 58 years through age 57 or 103 years with 3 consecutive normal Pap tests. Testing can be stopped between 65 and 70 years with 3 consecutive normal Pap tests and no abnormal Pap or HPV tests in the past 10 years.  HPV screening.** / Every 3 years from ages 53 years through ages 90 or 44 years with a history of 3 consecutive normal Pap tests. Testing can be stopped between 65 and 70 years with 3 consecutive normal Pap tests and no abnormal Pap or HPV tests in the past 10 years.  Fecal occult blood test (FOBT) of stool. / Every year beginning at age 37 years and continuing until age 76 years. You may not need to do this test if you get a colonoscopy every 10 years.  Flexible sigmoidoscopy or colonoscopy.** / Every 5 years for a flexible sigmoidoscopy or every 10 years for a colonoscopy beginning at age 7 years and continuing until age 19 years.  Hepatitis C blood test.** / For all people born from [birth year] through 1964 and any individual with known risks for hepatitis C.  Osteoporosis screening.** / A one-time screening for women ages 35 years and over and women at risk for fractures or osteoporosis.  Skin self-exam. / Monthly.  Influenza vaccine. / Every year.  Tetanus, diphtheria, and acellular pertussis (Tdap/Td) vaccine.** / 1 dose of Td every 10 years.  Varicella vaccine.** / Consult your health care provider.  Zoster vaccine.** / 1 dose for adults aged 41 years or older.  Pneumococcal 13-valent conjugate (PCV13) vaccine.** / Consult your health care  provider.  Pneumococcal polysaccharide (PPSV23) vaccine.** / 1 dose for all adults aged 69 years and older.  Meningococcal vaccine.** / Consult your health care provider.  Hepatitis A vaccine.** / Consult your health care provider.  Hepatitis B vaccine.** / Consult your health care provider.  Haemophilus influenzae type b (Hib) vaccine.** / Consult your health care provider. ** Family history and personal history of risk and conditions may change your health care provider's recommendations. Document Released: 12/05/2001 Document Revised: 02/23/2014 Document Reviewed: 03/06/2011 [facility] Patient Information 2015 [place], [place]. This information is not intended to replace advice given to you by your health care provider. Make sure you discuss any questions you have with your health care provider.

## 2014-12-14 ENCOUNTER — Telehealth: Payer: Self-pay

## 2014-12-14 DIAGNOSIS — C541 Malignant neoplasm of endometrium: Secondary | ICD-10-CM

## 2014-12-14 LAB — CYTOLOGY - PAP

## 2014-12-14 NOTE — Telephone Encounter (Signed)
Called Chelsey Clark and informed her of negative results. Chelsey Clark verbalized understanding and stated she needs refill of megace because she will be out at the end of the month and will not see Dr. Harolyn Rutherford until August. Informed her I would send Dr. Harolyn Rutherford a message regarding her request and call her back once she has responded. Chelsey Clark verbalized understanding and gratitude. No further questions or concerns.

## 2014-12-14 NOTE — Telephone Encounter (Signed)
-----   Message from Osborne Oman, MD sent at 12/14/2014  2:26 PM EST ----- Benign endometrial biopsy. Please call to inform patient of results.

## 2014-12-15 MED ORDER — MEGESTROL ACETATE 40 MG PO TABS: ORAL_TABLET | ORAL | Status: DC

## 2014-12-15 NOTE — Telephone Encounter (Signed)
-----   Message from Osborne Oman, MD sent at 12/15/2014  8:36 AM EST ----- Please refill.  Thanks!  [facility] ----- Message -----    From: Geanie Logan, RN    Sent: 12/14/2014   4:34 PM      To: Osborne Oman, MD  Hi Dr. Harolyn Rutherford,   Patient is requesting refill of megace-- will run out in 2 weeks and no appointment to see you until around August. Please advise.   Thank you,  Lovena Le

## 2014-12-15 NOTE — Telephone Encounter (Signed)
Megace refilled per order of Dr. Harolyn Rutherford. Called patient and informed her refill has been sent to her pharmacy. Patient verbalized understanding and gratitude. No questions or concerns.

## 2014-12-16 ENCOUNTER — Encounter: Payer: Self-pay | Admitting: Obstetrics & Gynecology

## 2014-12-16 DIAGNOSIS — R87810 Cervical high risk human papillomavirus (HPV) DNA test positive: Secondary | ICD-10-CM | POA: Insufficient documentation

## 2014-12-21 ENCOUNTER — Telehealth: Payer: Self-pay | Admitting: *Deleted

## 2014-12-21 NOTE — Telephone Encounter (Signed)
Contacted patient, results given, pt verbalizes understanding, has no further questions.

## 2014-12-21 NOTE — Telephone Encounter (Signed)
-----   Message from Osborne Oman, MD sent at 12/21/2014 12:33 PM EST ----- Negative for HPV 16 & 18/45. Normal cytology.  Repeat cotesting in one year.  Please call to inform patient of results and recommendations.

## 2014-12-28 ENCOUNTER — Encounter: Payer: Self-pay | Admitting: *Deleted

## 2015-02-05 ENCOUNTER — Telehealth: Payer: Self-pay | Admitting: *Deleted

## 2015-02-05 DIAGNOSIS — C541 Malignant neoplasm of endometrium: Secondary | ICD-10-CM

## 2015-02-05 MED ORDER — MEGESTROL ACETATE 40 MG PO TABS: ORAL_TABLET | ORAL | Status: AC

## 2015-02-05 NOTE — Telephone Encounter (Signed)
Patient called and stated that Megace rx was not received by her pharmacy. Per Dr. Arther Abbott previous note rx sent to pharmacy.

## 2015-11-24 DEATH — deceased
